# Patient Record
Sex: Female | Born: 1950 | Race: Black or African American | Hispanic: No | Marital: Married | Smoking: Never smoker
Health system: Southern US, Community
[De-identification: ages and names within clinical notes are randomized; demographics above are authoritative.]

## PROBLEM LIST (undated history)

## (undated) DIAGNOSIS — E785 Hyperlipidemia, unspecified: Secondary | ICD-10-CM

## (undated) DIAGNOSIS — E039 Hypothyroidism, unspecified: Secondary | ICD-10-CM

## (undated) HISTORY — DX: Hyperlipidemia, unspecified: E78.5

## (undated) HISTORY — PX: ABDOMINAL HYSTERECTOMY: SHX81

## (undated) HISTORY — PX: KNEE SURGERY: SHX244

## (undated) HISTORY — PX: TUBAL LIGATION: SHX77

---

## 2000-10-24 ENCOUNTER — Other Ambulatory Visit: Admission: RE | Admit: 2000-10-24 | Discharge: 2000-10-24 | Payer: Self-pay | Admitting: Family Medicine

## 2003-07-24 ENCOUNTER — Other Ambulatory Visit: Payer: Self-pay

## 2004-03-30 ENCOUNTER — Ambulatory Visit: Payer: Self-pay | Admitting: Family Medicine

## 2005-05-19 ENCOUNTER — Ambulatory Visit: Payer: Self-pay

## 2006-05-25 ENCOUNTER — Ambulatory Visit: Payer: Self-pay

## 2007-09-13 ENCOUNTER — Ambulatory Visit: Payer: Self-pay

## 2008-02-20 ENCOUNTER — Inpatient Hospital Stay: Payer: Self-pay | Admitting: General Practice

## 2008-09-17 ENCOUNTER — Ambulatory Visit: Payer: Self-pay

## 2009-09-17 ENCOUNTER — Ambulatory Visit: Payer: Self-pay

## 2010-11-11 ENCOUNTER — Ambulatory Visit: Payer: Self-pay

## 2011-11-12 ENCOUNTER — Ambulatory Visit: Payer: Self-pay | Admitting: Adult Health

## 2012-11-15 ENCOUNTER — Ambulatory Visit: Payer: Self-pay | Admitting: Family Medicine

## 2012-12-01 ENCOUNTER — Ambulatory Visit: Payer: Self-pay | Admitting: Surgery

## 2013-11-06 ENCOUNTER — Ambulatory Visit: Payer: Self-pay | Admitting: Family Medicine

## 2014-03-13 ENCOUNTER — Ambulatory Visit: Payer: Self-pay | Admitting: Family Medicine

## 2014-05-20 DIAGNOSIS — H35341 Macular cyst, hole, or pseudohole, right eye: Secondary | ICD-10-CM | POA: Diagnosis not present

## 2014-05-20 DIAGNOSIS — I10 Essential (primary) hypertension: Secondary | ICD-10-CM | POA: Diagnosis not present

## 2014-05-20 DIAGNOSIS — E785 Hyperlipidemia, unspecified: Secondary | ICD-10-CM | POA: Diagnosis not present

## 2014-05-20 DIAGNOSIS — E049 Nontoxic goiter, unspecified: Secondary | ICD-10-CM | POA: Diagnosis not present

## 2014-05-20 DIAGNOSIS — L729 Follicular cyst of the skin and subcutaneous tissue, unspecified: Secondary | ICD-10-CM | POA: Diagnosis not present

## 2014-05-21 DIAGNOSIS — E785 Hyperlipidemia, unspecified: Secondary | ICD-10-CM | POA: Diagnosis not present

## 2014-05-21 DIAGNOSIS — E049 Nontoxic goiter, unspecified: Secondary | ICD-10-CM | POA: Diagnosis not present

## 2014-08-07 DIAGNOSIS — Z23 Encounter for immunization: Secondary | ICD-10-CM | POA: Diagnosis not present

## 2014-08-07 DIAGNOSIS — Z008 Encounter for other general examination: Secondary | ICD-10-CM | POA: Diagnosis not present

## 2014-08-29 DIAGNOSIS — M1990 Unspecified osteoarthritis, unspecified site: Secondary | ICD-10-CM | POA: Diagnosis not present

## 2014-08-29 DIAGNOSIS — H35341 Macular cyst, hole, or pseudohole, right eye: Secondary | ICD-10-CM | POA: Diagnosis not present

## 2014-08-29 DIAGNOSIS — E049 Nontoxic goiter, unspecified: Secondary | ICD-10-CM | POA: Diagnosis not present

## 2014-08-29 DIAGNOSIS — I10 Essential (primary) hypertension: Secondary | ICD-10-CM | POA: Diagnosis not present

## 2014-08-29 DIAGNOSIS — E785 Hyperlipidemia, unspecified: Secondary | ICD-10-CM | POA: Diagnosis not present

## 2014-09-06 ENCOUNTER — Other Ambulatory Visit: Payer: Self-pay | Admitting: Family Medicine

## 2014-09-06 DIAGNOSIS — E042 Nontoxic multinodular goiter: Secondary | ICD-10-CM

## 2014-09-28 ENCOUNTER — Other Ambulatory Visit: Payer: Self-pay | Admitting: Family Medicine

## 2014-11-11 ENCOUNTER — Ambulatory Visit: Admission: RE | Admit: 2014-11-11 | Payer: Medicare Other | Source: Ambulatory Visit

## 2014-12-04 ENCOUNTER — Encounter: Payer: Self-pay | Admitting: *Deleted

## 2014-12-05 ENCOUNTER — Ambulatory Visit: Payer: Medicare Other | Admitting: Anesthesiology

## 2014-12-05 ENCOUNTER — Ambulatory Visit
Admission: RE | Admit: 2014-12-05 | Discharge: 2014-12-05 | Disposition: A | Discharge status: Home / Self Care | Payer: Medicare Other | Source: Ambulatory Visit | Attending: Gastroenterology | Admitting: Gastroenterology

## 2014-12-05 ENCOUNTER — Encounter: Payer: Self-pay | Admitting: *Deleted

## 2014-12-05 ENCOUNTER — Encounter
Admission: RE | Disposition: A | Discharge status: Home / Self Care | Payer: Self-pay | Source: Ambulatory Visit | Attending: Gastroenterology

## 2014-12-05 DIAGNOSIS — Z1211 Encounter for screening for malignant neoplasm of colon: Secondary | ICD-10-CM | POA: Diagnosis not present

## 2014-12-05 DIAGNOSIS — Z79899 Other long term (current) drug therapy: Secondary | ICD-10-CM | POA: Diagnosis not present

## 2014-12-05 DIAGNOSIS — E039 Hypothyroidism, unspecified: Secondary | ICD-10-CM | POA: Insufficient documentation

## 2014-12-05 DIAGNOSIS — I10 Essential (primary) hypertension: Secondary | ICD-10-CM | POA: Insufficient documentation

## 2014-12-05 DIAGNOSIS — K5790 Diverticulosis of intestine, part unspecified, without perforation or abscess without bleeding: Secondary | ICD-10-CM | POA: Diagnosis not present

## 2014-12-05 DIAGNOSIS — K5730 Diverticulosis of large intestine without perforation or abscess without bleeding: Secondary | ICD-10-CM | POA: Diagnosis not present

## 2014-12-05 HISTORY — DX: Hypothyroidism, unspecified: E03.9

## 2014-12-05 HISTORY — PX: COLONOSCOPY WITH PROPOFOL: SHX5780

## 2014-12-05 SURGERY — COLONOSCOPY WITH PROPOFOL
Anesthesia: General

## 2014-12-05 MED ORDER — FENTANYL CITRATE (PF) 100 MCG/2ML IJ SOLN
INTRAMUSCULAR | Status: DC | PRN
  Administered 2014-12-05: 50 ug via INTRAVENOUS

## 2014-12-05 MED ORDER — SODIUM CHLORIDE 0.9 % IV SOLN
INTRAVENOUS | Status: DC
  Administered 2014-12-05: 10:00:00 via INTRAVENOUS

## 2014-12-05 MED ORDER — PROPOFOL INFUSION 10 MG/ML OPTIME
INTRAVENOUS | Status: DC | PRN
  Administered 2014-12-05: 120 ug/kg/min via INTRAVENOUS

## 2014-12-05 MED ORDER — MIDAZOLAM HCL 2 MG/2ML IJ SOLN
INTRAMUSCULAR | Status: DC | PRN
  Administered 2014-12-05: 1 mg via INTRAVENOUS

## 2014-12-05 NOTE — Anesthesia Preprocedure Evaluation (Signed)
Anesthesia Evaluation  Patient identified by MRN, date of birth, ID band Patient awake    Reviewed: Allergy & Precautions, H&P , NPO status , Patient's Chart, lab work & pertinent test results, reviewed documented beta blocker date and time   History of Anesthesia Complications Negative for: history of anesthetic complications  Airway Mallampati: I  TM Distance: >3 FB Neck ROM: full    Dental no notable dental hx.    Pulmonary neg pulmonary ROS,  breath sounds clear to auscultation  Pulmonary exam normal       Cardiovascular Exercise Tolerance: Good hypertension, - angina- CAD, - Past MI and - CABG Normal cardiovascular examRhythm:regular Rate:Normal     Neuro/Psych negative neurological ROS  negative psych ROS   GI/Hepatic negative GI ROS, Neg liver ROS,   Endo/Other  neg diabetesHypothyroidism   Renal/GU negative Renal ROS  negative genitourinary   Musculoskeletal   Abdominal   Peds  Hematology negative hematology ROS (+)   Anesthesia Other Findings Past Medical History:   Hypertension                                                 Hypothyroidism                                               Reproductive/Obstetrics negative OB ROS                             Anesthesia Physical Anesthesia Plan  ASA: II  Anesthesia Plan: General   Post-op Pain Management:    Induction:   Airway Management Planned:   Additional Equipment:   Intra-op Plan:   Post-operative Plan:   Informed Consent: I have reviewed the patients History and Physical, chart, labs and discussed the procedure including the risks, benefits and alternatives for the proposed anesthesia with the patient or authorized representative who has indicated his/her understanding and acceptance.   Dental Advisory Given  Plan Discussed with: Anesthesiologist, CRNA and Surgeon  Anesthesia Plan Comments:          Anesthesia Quick Evaluation

## 2014-12-05 NOTE — Op Note (Signed)
[facility] Patient Name: Sharon Craig Procedure Date: 12/05/2014 10:44 AM MRN: [id number] Account #: [id number] Date of Birth: [birth year] Admit Type: Outpatient Age: 64 Room: [facility] ENDO ROOM 4 Gender: Female Note Status: Finalized Procedure:         Colonoscopy Indications:       Screening for colorectal malignant neoplasm Providers:         Ezzard Standing. Bluford Kaufmann, MD Referring MD:      Janeann Forehand, MD (Referring MD) Medicines:         Monitored Anesthesia Care Complications:     No immediate complications. Procedure:         Pre-Anesthesia Assessment:                    - Prior to the procedure, a History and Physical was                     performed, and patient medications, allergies and                     sensitivities were reviewed. The patient's tolerance of                     previous anesthesia was reviewed.                    - The risks and benefits of the procedure and the sedation                     options and risks were discussed with the patient. All                     questions were answered and informed consent was obtained.                    - After reviewing the risks and benefits, the patient was                     deemed in satisfactory condition to undergo the procedure.                    After obtaining informed consent, the colonoscope was                     passed under direct vision. Throughout the procedure, the                     patient's blood pressure, pulse, and oxygen saturations                     were monitored continuously. The Colonoscope was                     introduced through the anus and advanced to the the cecum,                     identified by appendiceal orifice and ileocecal valve. The                     colonoscopy was performed without difficulty. The patient                     tolerated the procedure well. The quality of the bowel  preparation was good. Findings:      A few small-mouthed diverticula were found in the sigmoid colon.      The exam was otherwise without abnormality. Impression:        - Diverticulosis in the sigmoid colon.                    - The examination was otherwise normal.                    - No specimens collected. Recommendation:    - Discharge patient to home.                    - Repeat colonoscopy in 10 years for surveillance.                    - The findings and recommendations were discussed with the                     patient. Procedure Code(s): --- Professional ---                    [phone], Colonoscopy, flexible; diagnostic, including                     collection of specimen(s) by brushing or washing, when                     performed (separate procedure) Diagnosis Code(s): --- Professional ---                    Z12.11, Encounter for screening for malignant neoplasm of                     colon                    K57.30, Diverticulosis of large intestine without                     perforation or abscess without bleeding CPT copyright 2014 American Medical Association. All rights reserved. The codes documented in this report are preliminary and upon coder review may  be revised to meet current compliance requirements. Wallace Cullens, MD 12/05/2014 10:56:38 AM This report has been signed electronically. Number of Addenda: 0 Note Initiated On: 12/05/2014 10:44 AM Scope Withdrawal Time: 0 hours 3 minutes 49 seconds  Total Procedure Duration: 0 hours 6 minutes 22 seconds       [facility]

## 2014-12-05 NOTE — Transfer of Care (Signed)
Immediate Anesthesia Transfer of Care Note  Patient: Sharon Craig  Procedure(s) Performed: Procedure(s): COLONOSCOPY WITH PROPOFOL (N/A)  Patient Location: PACU  Anesthesia Type:General  Level of Consciousness: awake and sedated  Airway & Oxygen Therapy: Patient Spontanous Breathing and Patient connected to nasal cannula oxygen  Post-op Assessment: Report given to RN and Post -op Vital signs reviewed and stable  Post vital signs: Reviewed and stable  Last Vitals:  Filed Vitals:   12/05/14 1013  BP: 118/97  Pulse: 84  Temp: 36.2 C  Resp: 20    Complications: No apparent anesthesia complications

## 2014-12-05 NOTE — Anesthesia Procedure Notes (Signed)
Performed by: COOK-MARTIN, Musette Kisamore Pre-anesthesia Checklist: Patient identified, Emergency Drugs available, Suction available, Patient being monitored and Timeout performed Patient Re-evaluated:Patient Re-evaluated prior to inductionOxygen Delivery Method: Nasal cannula Preoxygenation: Pre-oxygenation with 100% oxygen Intubation Type: IV induction Airway Equipment and Method: Oral airway Placement Confirmation: positive ETCO2 and CO2 detector       

## 2014-12-05 NOTE — H&P (Signed)
    Primary Care Physician:  Fidel Levy, MD Primary Gastroenterologist:  Dr. Bluford Kaufmann  Pre-Procedure History & Physical: HPI:  Sharon Craig is a 64 y.o. female is here for an colonoscopy.   Past Medical History  Diagnosis Date  . Hypertension   . Hypothyroidism     No past surgical history on file.  Prior to Admission medications   Medication Sig Start Date End Date Taking? Authorizing Provider  amLODipine (NORVASC) 5 MG tablet Take 5 mg by mouth daily.   Yes Historical Provider, MD  atenolol (TENORMIN) 50 MG tablet Take 50 mg by mouth daily.   Yes Historical Provider, MD  atorvastatin (LIPITOR) 20 MG tablet Take 20 mg by mouth daily.   Yes Historical Provider, MD  cetirizine (ZYRTEC) 10 MG tablet Take 10 mg by mouth daily.   Yes Historical Provider, MD  hydrochlorothiazide (HYDRODIURIL) 25 MG tablet Take 25 mg by mouth daily.   Yes Historical Provider, MD  omega-3 acid ethyl esters (LOVAZA) 1 G capsule Take by mouth 2 (two) times daily.   Yes Historical Provider, MD  levothyroxine (SYNTHROID, LEVOTHROID) 25 MCG tablet TAKE 1 TABLET BY MOUTH DAILY. 09/30/14   Amy Rusty Aus, NP    Allergies as of 11/19/2014  . (Not on File)    No family history on file.  Social History   Social History  . Marital Status: Married    Spouse Name: N/A  . Number of Children: N/A  . Years of Education: N/A   Occupational History  . Not on file.   Social History Main Topics  . Smoking status: Not on file  . Smokeless tobacco: Not on file  . Alcohol Use: Not on file  . Drug Use: Not on file  . Sexual Activity: Not on file   Other Topics Concern  . Not on file   Social History Narrative  . No narrative on file    Review of Systems: See HPI, otherwise negative ROS  Physical Exam: There were no vitals taken for this visit. General:   Alert,  pleasant and cooperative in NAD Head:  Normocephalic and atraumatic. Neck:  Supple; no masses or thyromegaly. Lungs:  Clear  throughout to auscultation.    Heart:  Regular rate and rhythm. Abdomen:  Soft, nontender and nondistended. Normal bowel sounds, without guarding, and without rebound.   Neurologic:  Alert and  oriented x4;  grossly normal neurologically.  Impression/Plan: Sharon Craig is here for an colonoscopy to be performed for screening.  Risks, benefits, limitations, and alternatives regarding  colonoscopy have been reviewed with the patient.  Questions have been answered.  All parties agreeable.   Yoshio Seliga, Ezzard Standing, MD  12/05/2014, 10:02 AM

## 2014-12-06 ENCOUNTER — Encounter: Payer: Self-pay | Admitting: Gastroenterology

## 2014-12-06 NOTE — Anesthesia Postprocedure Evaluation (Signed)
  Anesthesia Post-op Note  Patient: Sharon Craig  Procedure(s) Performed: Procedure(s): COLONOSCOPY WITH PROPOFOL (N/A)  Anesthesia type:General  Patient location: PACU  Post pain: Pain level controlled  Post assessment: Post-op Vital signs reviewed, Patient's Cardiovascular Status Stable, Respiratory Function Stable, Patent Airway and No signs of Nausea or vomiting  Post vital signs: Reviewed and stable  Last Vitals:  Filed Vitals:   12/05/14 1145  BP:   Pulse: 66  Temp:   Resp: 14    Level of consciousness: awake, alert  and patient cooperative  Complications: No apparent anesthesia complications

## 2014-12-06 NOTE — Progress Notes (Signed)
Patient "doing fine".  Reports no issues or questions.

## 2014-12-12 ENCOUNTER — Encounter: Payer: Self-pay | Admitting: Family Medicine

## 2014-12-12 ENCOUNTER — Ambulatory Visit (INDEPENDENT_AMBULATORY_CARE_PROVIDER_SITE_OTHER): Payer: Medicare Other | Admitting: Family Medicine

## 2014-12-12 VITALS — BP 116/77 | HR 67 | Temp 98.0°F | Resp 16 | Ht 68.0 in | Wt 218.0 lb

## 2014-12-12 DIAGNOSIS — E663 Overweight: Secondary | ICD-10-CM | POA: Insufficient documentation

## 2014-12-12 DIAGNOSIS — J302 Other seasonal allergic rhinitis: Secondary | ICD-10-CM | POA: Insufficient documentation

## 2014-12-12 DIAGNOSIS — E785 Hyperlipidemia, unspecified: Secondary | ICD-10-CM | POA: Insufficient documentation

## 2014-12-12 DIAGNOSIS — E669 Obesity, unspecified: Secondary | ICD-10-CM

## 2014-12-12 DIAGNOSIS — E039 Hypothyroidism, unspecified: Secondary | ICD-10-CM | POA: Insufficient documentation

## 2014-12-12 DIAGNOSIS — Z1331 Encounter for screening for depression: Secondary | ICD-10-CM

## 2014-12-12 DIAGNOSIS — E049 Nontoxic goiter, unspecified: Secondary | ICD-10-CM

## 2014-12-12 DIAGNOSIS — M1990 Unspecified osteoarthritis, unspecified site: Secondary | ICD-10-CM

## 2014-12-12 DIAGNOSIS — I10 Essential (primary) hypertension: Secondary | ICD-10-CM

## 2014-12-12 DIAGNOSIS — H35341 Macular cyst, hole, or pseudohole, right eye: Secondary | ICD-10-CM | POA: Insufficient documentation

## 2014-12-12 NOTE — Progress Notes (Signed)
Name: Sharon Craig   MRN: [id number]    DOB: Aug 02, 1950   Date:12/12/2014       Progress Note  Subjective  Chief Complaint  Chief Complaint  Patient presents with  . Hypertension  . Hyperlipidemia  . Hypothyroidism    thyroid nodule    HPI  Her for f/u of HBP, Hyperlipidemia, and thyroid nodule.  Needs thyroid US.  And needs TSH since upping Levothyroxine dose.  She is taking meds and feels well./  No problem-specific assessment & plan notes found for this encounter.   Past Medical History  Diagnosis Date  . Hypertension   . Hypothyroidism     Social History  Substance Use Topics  . Smoking status: Never Smoker   . Smokeless tobacco: Never Used  . Alcohol Use: No     Current outpatient prescriptions:  .  amLODipine (NORVASC) 10 MG tablet, Take 10 mg by mouth daily., Disp: , Rfl:  .  atenolol (TENORMIN) 50 MG tablet, Take 25 mg by mouth daily. , Disp: , Rfl:  .  atorvastatin (LIPITOR) 20 MG tablet, Take 20 mg by mouth daily., Disp: , Rfl:  .  cetirizine (ZYRTEC) 10 MG tablet, Take 10 mg by mouth daily., Disp: , Rfl:  .  DHA-EPA-VITAMIN E PO, Take 1 capsule by mouth daily., Disp: , Rfl:  .  hydrochlorothiazide (HYDRODIURIL) 25 MG tablet, Take 25 mg by mouth daily., Disp: , Rfl:  .  levothyroxine (SYNTHROID, LEVOTHROID) 25 MCG tablet, TAKE 1 TABLET BY MOUTH DAILY., Disp: 30 tablet, Rfl: 6 .  omega-3 acid ethyl esters (LOVAZA) 1 G capsule, Take by mouth 2 (two) times daily., Disp: , Rfl:   Allergies  Allergen Reactions  . Hydrocodone Nausea Only    Review of Systems  Constitutional: Negative for fever, chills, weight loss and malaise/fatigue.  HENT: Negative for hearing loss.   Eyes: Negative for blurred vision and double vision.  Respiratory: Negative for cough, sputum production, shortness of breath and wheezing.   Cardiovascular: Negative for chest pain, palpitations, orthopnea and leg swelling.  Gastrointestinal: Negative for heartburn, nausea,  vomiting, abdominal pain, diarrhea and blood in stool.  Genitourinary: Negative for dysuria, urgency and frequency.  Musculoskeletal: Negative for myalgias and joint pain.  Skin: Negative for rash.  Neurological: Negative for dizziness, sensory change, focal weakness, weakness and headaches.       Objective  Filed Vitals:   12/12/14 0908  BP: 116/77  Pulse: 67  Temp: 98 F (36.7 C)  TempSrc: Oral  Resp: 16  Height:  (1.727 m)  Weight: 218 lb (98.884 kg)     Physical Exam  Constitutional: She is well-developed, well-nourished, and in no distress.  HENT:  Head: Normocephalic and atraumatic.  Eyes: Conjunctivae and EOM are normal. Pupils are equal, round, and reactive to light.  Neck: Normal range of motion. Neck supple. Carotid bruit is not present. Thyroid mass (bilateral thyroid nodules) and thyromegaly present.  Cardiovascular: Normal rate, regular rhythm, normal heart sounds and intact distal pulses.  Exam reveals no gallop and no friction rub.   No murmur heard. Pulmonary/Chest: Effort normal and breath sounds normal. No respiratory distress. She has no wheezes. She has no rales.  Abdominal: Soft. Bowel sounds are normal. She exhibits no distension and no mass. There is no tenderness.  Musculoskeletal: She exhibits no edema.  Lymphadenopathy:    She has no cervical adenopathy.  Vitals reviewed.      No results found for this or any previous visit (  from the past 2160 hour(s)).   Assessment & Plan  1. Essential hypertension, malignant  - Comprehensive Metabolic Panel (CMET)  2. Enlargement of thyroid  - US Soft Tissue Head/Neck; Future-thyroid - CBC with Differential - TSH  3. Dyslipidemia  - Lipid Profile

## 2014-12-12 NOTE — Patient Instructions (Signed)
Continue current meds.    Discussed diet and weight loss.

## 2014-12-13 LAB — COMPREHENSIVE METABOLIC PANEL
A/G RATIO: 1.7 (ref 1.1–2.5)
ALBUMIN: 4.3 g/dL (ref 3.6–4.8)
ALK PHOS: 102 IU/L (ref 39–117)
ALT: 17 IU/L (ref 0–32)
AST: 17 IU/L (ref 0–40)
BILIRUBIN TOTAL: 0.5 mg/dL (ref 0.0–1.2)
BUN / CREAT RATIO: 22 (ref 11–26)
BUN: 15 mg/dL (ref 8–27)
CHLORIDE: 99 mmol/L (ref 97–108)
CO2: 27 mmol/L (ref 18–29)
Calcium: 9.6 mg/dL (ref 8.7–10.3)
Creatinine, Ser: 0.68 mg/dL (ref 0.57–1.00)
GFR calc non Af Amer: 91 mL/min/{1.73_m2} (ref >59)
GFR, EST AFRICAN AMERICAN: 105 mL/min/{1.73_m2} (ref >59)
GLUCOSE: 93 mg/dL (ref 65–99)
Globulin, Total: 2.5 g/dL (ref 1.5–4.5)
POTASSIUM: 3.7 mmol/L (ref 3.5–5.2)
Sodium: 141 mmol/L (ref 134–144)
TOTAL PROTEIN: 6.8 g/dL (ref 6.0–8.5)

## 2014-12-13 LAB — CBC WITH DIFFERENTIAL/PLATELET
Basophils Absolute: 0.1 10*3/uL (ref 0.0–0.2)
Basos: 1 %
EOS (ABSOLUTE): 0.1 10*3/uL (ref 0.0–0.4)
Eos: 2 %
Hematocrit: 37.5 % (ref 34.0–46.6)
Hemoglobin: 12 g/dL (ref 11.1–15.9)
Immature Grans (Abs): 0 10*3/uL (ref 0.0–0.1)
Immature Granulocytes: 0 %
Lymphocytes Absolute: 2.1 10*3/uL (ref 0.7–3.1)
Lymphs: 38 %
MCH: 21.5 pg — ABNORMAL LOW (ref 26.6–33.0)
MCHC: 32 g/dL (ref 31.5–35.7)
MCV: 67 fL — ABNORMAL LOW (ref 79–97)
Monocytes Absolute: 0.5 10*3/uL (ref 0.1–0.9)
Monocytes: 10 %
Neutrophils Absolute: 2.8 10*3/uL (ref 1.4–7.0)
Neutrophils: 49 %
Platelets: 181 10*3/uL (ref 150–379)
RBC: 5.57 x10E6/uL — ABNORMAL HIGH (ref 3.77–5.28)
RDW: 16.6 % — ABNORMAL HIGH (ref 12.3–15.4)
WBC: 5.6 10*3/uL (ref 3.4–10.8)

## 2014-12-13 LAB — LIPID PANEL
Chol/HDL Ratio: 3.4 ratio units (ref 0.0–4.4)
Cholesterol, Total: 162 mg/dL (ref 100–199)
HDL: 47 mg/dL (ref >39)
LDL Calculated: 97 mg/dL (ref 0–99)
Triglycerides: 90 mg/dL (ref 0–149)
VLDL Cholesterol Cal: 18 mg/dL (ref 5–40)

## 2014-12-13 LAB — TSH: TSH: 2.02 u[IU]/mL (ref 0.450–4.500)

## 2014-12-17 ENCOUNTER — Ambulatory Visit
Admission: RE | Admit: 2014-12-17 | Discharge: 2014-12-17 | Disposition: A | Discharge status: Home / Self Care | Payer: Medicare Other | Source: Ambulatory Visit | Attending: Family Medicine | Admitting: Family Medicine

## 2014-12-17 DIAGNOSIS — E042 Nontoxic multinodular goiter: Secondary | ICD-10-CM | POA: Diagnosis not present

## 2014-12-17 DIAGNOSIS — E049 Nontoxic goiter, unspecified: Secondary | ICD-10-CM

## 2014-12-27 DIAGNOSIS — H2513 Age-related nuclear cataract, bilateral: Secondary | ICD-10-CM | POA: Diagnosis not present

## 2015-02-12 ENCOUNTER — Other Ambulatory Visit: Payer: Self-pay | Admitting: *Deleted

## 2015-02-12 ENCOUNTER — Telehealth: Payer: Self-pay | Admitting: Family Medicine

## 2015-02-12 DIAGNOSIS — Z87898 Personal history of other specified conditions: Secondary | ICD-10-CM

## 2015-02-12 DIAGNOSIS — Z1239 Encounter for other screening for malignant neoplasm of breast: Secondary | ICD-10-CM

## 2015-02-12 NOTE — Telephone Encounter (Signed)
Done and patient given # to call and schedule.

## 2015-02-12 NOTE — Telephone Encounter (Signed)
Is this just a reg. Mammogram?

## 2015-02-12 NOTE — Telephone Encounter (Signed)
Pt needs a mammo scheduled for the end of Nov or first of Dec.  She discussed a knot under her arm at last visit.  Her call back number is [phone]

## 2015-02-12 NOTE — Telephone Encounter (Signed)
Yes, just order a regular screening mammogram.-jh

## 2015-03-17 ENCOUNTER — Encounter: Payer: Self-pay | Admitting: Family Medicine

## 2015-03-17 ENCOUNTER — Ambulatory Visit (INDEPENDENT_AMBULATORY_CARE_PROVIDER_SITE_OTHER): Payer: Medicare Other | Admitting: Family Medicine

## 2015-03-17 VITALS — BP 130/65 | HR 60 | Resp 16 | Ht 68.0 in | Wt 221.8 lb

## 2015-03-17 DIAGNOSIS — E049 Nontoxic goiter, unspecified: Secondary | ICD-10-CM

## 2015-03-17 DIAGNOSIS — E785 Hyperlipidemia, unspecified: Secondary | ICD-10-CM | POA: Diagnosis not present

## 2015-03-17 DIAGNOSIS — I10 Essential (primary) hypertension: Secondary | ICD-10-CM

## 2015-03-17 DIAGNOSIS — Z23 Encounter for immunization: Secondary | ICD-10-CM

## 2015-03-17 MED ORDER — ATENOLOL 25 MG PO TABS: 25.0000 mg | ORAL_TABLET | Freq: Every day | ORAL | Status: DC

## 2015-03-17 NOTE — Addendum Note (Signed)
Addended by: Clayborne ArtistHOLDEN, Zamiya Dillard A on: 03/17/2015 12:02 PM   Modules accepted: Kipp BroodSmartSet

## 2015-03-17 NOTE — Patient Instructions (Signed)
Continue current meds at same dose.

## 2015-03-17 NOTE — Progress Notes (Signed)
Name: Sharon Craig   MRN: [id number]    DOB: May 20, 1950   Date:03/17/2015       Progress Note  Subjective  Chief Complaint  Chief Complaint  Patient presents with  . Hypertension    trouble splitting Atenolol.     HPI Here for f/u of HBP.  Also hypothyroid.  Taking all meds and feeling well  No problem-specific assessment & plan notes found for this encounter.   Past Medical History  Diagnosis Date  . Hypertension   . Hypothyroidism     Past Surgical History  Procedure Laterality Date  . Abdominal hysterectomy    . Knee surgery Left   . Colonoscopy with propofol N/A 12/05/2014    Procedure: COLONOSCOPY WITH PROPOFOL;  Surgeon: Wallace Cullens, MD;  Location: [facility] ENDOSCOPY;  Service: Gastroenterology;  Laterality: N/A;  . Tubal ligation      Family History  Problem Relation Age of Onset  . Heart failure Mother   . Hypertension Mother   . Gout Mother   . Heart Problems Sister   . Heart Problems Brother     Social History   Social History  . Marital Status: Married    Spouse Name: N/A  . Number of Children: N/A  . Years of Education: N/A   Occupational History  . Not on file.   Social History Main Topics  . Smoking status: Never Smoker   . Smokeless tobacco: Never Used  . Alcohol Use: No  . Drug Use: No  . Sexual Activity: Not on file   Other Topics Concern  . Not on file   Social History Narrative     Current outpatient prescriptions:  .  amLODipine (NORVASC) 10 MG tablet, Take 10 mg by mouth daily., Disp: , Rfl:  .  atenolol (TENORMIN) 25 MG tablet, Take 1 tablet (25 mg total) by mouth daily., Disp: 30 tablet, Rfl: 12 .  atorvastatin (LIPITOR) 20 MG tablet, Take 20 mg by mouth daily., Disp: , Rfl:  .  cetirizine (ZYRTEC) 10 MG tablet, Take 10 mg by mouth daily., Disp: , Rfl:  .  ferrous sulfate 325 (65 FE) MG tablet, Take 325 mg by mouth daily with breakfast., Disp: , Rfl:  .  hydrochlorothiazide (HYDRODIURIL) 25 MG tablet, Take 25 mg by  mouth daily., Disp: , Rfl:  .  levothyroxine (SYNTHROID, LEVOTHROID) 25 MCG tablet, TAKE 1 TABLET BY MOUTH DAILY., Disp: 30 tablet, Rfl: 6  Allergies  Allergen Reactions  . Hydrocodone Nausea Only     Review of Systems  Constitutional: Negative for fever, chills, weight loss and malaise/fatigue.  HENT: Negative for hearing loss.   Eyes: Negative for blurred vision and double vision.  Respiratory: Negative for cough, shortness of breath and wheezing.   Cardiovascular: Negative for chest pain, palpitations and leg swelling.  Gastrointestinal: Negative for heartburn, abdominal pain and blood in stool.  Genitourinary: Negative for dysuria, urgency and frequency.  Musculoskeletal: Negative for myalgias and joint pain.  Skin: Negative for rash.  Neurological: Positive for headaches (occasional). Negative for dizziness, tremors and weakness.      Objective  Filed Vitals:   03/17/15 0946 03/17/15 1041  BP: 120/73 130/65  Pulse: 60   Resp: 16   Height:  (1.727 m)   Weight: 221 lb 12.8 oz (100.608 kg)     Physical Exam  Constitutional: She is oriented to person, place, and time and well-developed, well-nourished, and in no distress. No distress.  HENT:  Head: Normocephalic and  atraumatic.  Eyes: Conjunctivae and EOM are normal. Pupils are equal, round, and reactive to light. No scleral icterus.  Neck: Normal range of motion. Neck supple. Carotid bruit is not present. Thyromegaly (bilateral thyroid goiter.) present.  Cardiovascular: Normal rate, regular rhythm, normal heart sounds and intact distal pulses.  Exam reveals no gallop and no friction rub.   No murmur heard. Pulmonary/Chest: Effort normal and breath sounds normal. No respiratory distress. She has no wheezes. She has no rales.  Abdominal: Soft. Bowel sounds are normal. She exhibits no distension, no abdominal bruit and no mass. There is no tenderness.  Musculoskeletal: Normal range of motion. She exhibits edema  (bilateral trace pedal edema.).  Lymphadenopathy:    She has no cervical adenopathy.  Neurological: She is alert and oriented to person, place, and time.  Vitals reviewed.      No results found for this or any previous visit (from the past 2160 hour(s)).   Assessment & Plan  Problem List Items Addressed This Visit      Cardiovascular and Mediastinum   Essential hypertension, malignant - Primary   Relevant Medications   atenolol (TENORMIN) 25 MG tablet     Endocrine   Enlargement of thyroid   Relevant Medications   atenolol (TENORMIN) 25 MG tablet     Other   Dyslipidemia   Need for influenza vaccination   Relevant Orders   Flu vaccine HIGH DOSE PF (Fluzone High dose)      Meds ordered this encounter  Medications  . ferrous sulfate 325 (65 FE) MG tablet    Sig: Take 325 mg by mouth daily with breakfast.  . atenolol (TENORMIN) 25 MG tablet    Sig: Take 1 tablet (25 mg total) by mouth daily.    Dispense:  30 tablet    Refill:  12   1. Essential hypertension, malignant  - atenolol (TENORMIN) 25 MG tablet; Take 1 tablet (25 mg total) by mouth daily.  Dispense: 30 tablet; Refill: 12  2. Need for influenza vaccination  - Flu vaccine HIGH DOSE PF (Fluzone High dose)  3. Enlargement of thyroid   4. Dyslipidemia

## 2015-03-18 ENCOUNTER — Other Ambulatory Visit: Payer: Self-pay | Admitting: Family Medicine

## 2015-03-18 ENCOUNTER — Ambulatory Visit
Admission: RE | Admit: 2015-03-18 | Discharge: 2015-03-18 | Disposition: A | Discharge status: Home / Self Care | Payer: Medicare Other | Source: Ambulatory Visit | Attending: Family Medicine | Admitting: Family Medicine

## 2015-03-18 DIAGNOSIS — R928 Other abnormal and inconclusive findings on diagnostic imaging of breast: Secondary | ICD-10-CM | POA: Diagnosis not present

## 2015-03-18 DIAGNOSIS — Z87898 Personal history of other specified conditions: Secondary | ICD-10-CM

## 2015-03-18 DIAGNOSIS — Z1239 Encounter for other screening for malignant neoplasm of breast: Secondary | ICD-10-CM

## 2015-04-03 ENCOUNTER — Other Ambulatory Visit: Payer: Self-pay | Admitting: Family Medicine

## 2015-04-16 ENCOUNTER — Other Ambulatory Visit: Payer: Self-pay | Admitting: Family Medicine

## 2015-05-04 ENCOUNTER — Other Ambulatory Visit: Payer: Self-pay | Admitting: Family Medicine

## 2015-06-06 DIAGNOSIS — H2513 Age-related nuclear cataract, bilateral: Secondary | ICD-10-CM | POA: Diagnosis not present

## 2015-06-06 DIAGNOSIS — H35341 Macular cyst, hole, or pseudohole, right eye: Secondary | ICD-10-CM | POA: Diagnosis not present

## 2015-07-07 ENCOUNTER — Other Ambulatory Visit: Payer: Self-pay | Admitting: Family Medicine

## 2015-07-29 ENCOUNTER — Other Ambulatory Visit: Payer: Self-pay | Admitting: Family Medicine

## 2015-08-21 ENCOUNTER — Encounter: Payer: Self-pay | Admitting: Family Medicine

## 2015-08-21 ENCOUNTER — Ambulatory Visit (INDEPENDENT_AMBULATORY_CARE_PROVIDER_SITE_OTHER): Payer: Medicare Other | Admitting: Family Medicine

## 2015-08-21 ENCOUNTER — Other Ambulatory Visit: Payer: Self-pay | Admitting: Family Medicine

## 2015-08-21 VITALS — BP 119/61 | HR 67 | Temp 98.2°F | Resp 16 | Ht 68.0 in | Wt 219.0 lb

## 2015-08-21 DIAGNOSIS — Z658 Other specified problems related to psychosocial circumstances: Secondary | ICD-10-CM

## 2015-08-21 DIAGNOSIS — E049 Nontoxic goiter, unspecified: Secondary | ICD-10-CM

## 2015-08-21 DIAGNOSIS — I10 Essential (primary) hypertension: Secondary | ICD-10-CM

## 2015-08-21 DIAGNOSIS — R51 Headache: Secondary | ICD-10-CM | POA: Diagnosis not present

## 2015-08-21 DIAGNOSIS — F439 Reaction to severe stress, unspecified: Secondary | ICD-10-CM

## 2015-08-21 DIAGNOSIS — J302 Other seasonal allergic rhinitis: Secondary | ICD-10-CM

## 2015-08-21 DIAGNOSIS — R519 Headache, unspecified: Secondary | ICD-10-CM

## 2015-08-21 MED ORDER — FLUTICASONE PROPIONATE 50 MCG/ACT NA SUSP: 2.0000 | Freq: Every day | NASAL | Status: DC

## 2015-08-21 NOTE — Patient Instructions (Addendum)
Allergies: Let's try a flonase or flonase sensimist OTC for allergies- it is 15.99 and there are coupons online.  Try allergy medication daily to help with HA. Tylenol and ibuprofen are fine to help with HA.   Stress- Try 30 minutes of exercise daily to help with stress. There are also great videos on Youtube for mindfulness/meditation for stress relief. If you want to try a medication for stress relief, please let us know.

## 2015-08-21 NOTE — Assessment & Plan Note (Signed)
Controlled. Continue current regimen. Check BMET. Encouraged dash diet. Recheck 3 mos.

## 2015-08-21 NOTE — Assessment & Plan Note (Signed)
Likely contributing to HA. Start Flonase daily to help with symptoms. Continue zyrtec.

## 2015-08-21 NOTE — Assessment & Plan Note (Signed)
Stable. Check TSH. US due on August 2017.

## 2015-08-21 NOTE — Progress Notes (Signed)
Subjective:    Patient ID: Sharon Craig, female    DOB: 28-Sep-1950, 65 y.o.   MRN: [id number]  HPI: Sharon Craig is a 65 y.o. female presenting on 08/21/2015 for Hypertension and Headache   HPI  Pt presents for blood pressure follow-up. Doing well at home. No CP or SOB. No syncopal episodes. No dizziness. No visual change- known macular issues. Taking medications as prescribed She is also having headaches. Did not check blood pressure.  Had previously HA. She thinks it is related to her allergies or previous head injury. HA is frontal and she puts vicks vapor rub on it to help with her symptoms. It does relief the HA.  She does have allergies in her eyes. HA started with tingling in head and have worsened with allergy season.  Thyroids: Taking 25mg  Thyroid goiter feels the same.  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 She feels her HA increased due to stress. No SI/HI.  Just feeling overwhelmed with current obligations.   Past Medical History  Diagnosis Date  . Hypertension   . Hypothyroidism     Current Outpatient Prescriptions on File Prior to Visit  Medication Sig  . amLODipine (NORVASC) 10 MG tablet TAKE 1 TABLET BY MOUTH DAILY  . atenolol (TENORMIN) 25 MG tablet Take 1 tablet (25 mg total) by mouth daily.  Marland Kitchen atorvastatin (LIPITOR) 20 MG tablet TAKE 1 TABLET BY MOUTH EVERY DAY  . cetirizine (ZYRTEC) 10 MG tablet Take 10 mg by mouth daily.  . ferrous sulfate 325 (65 FE) MG tablet Take 325 mg by mouth daily with breakfast.  . hydrochlorothiazide (HYDRODIURIL) 25 MG tablet TAKE 1 TABLET BY MOUTH EVERY DAY  . levothyroxine (SYNTHROID, LEVOTHROID) 25 MCG tablet TAKE 1 TABLET BY MOUTH DAILY   No current facility-administered medications on file prior to visit.    Review of Systems  Constitutional: Negative for fever and chills.  HENT: Negative for drooling and trouble swallowing.   Eyes: Negative for visual disturbance.    Respiratory: Negative for cough, chest tightness and wheezing.   Cardiovascular: Negative for chest pain and leg swelling.  Gastrointestinal: Negative for nausea, vomiting, abdominal pain, diarrhea and constipation.  Endocrine: Negative.  Negative for cold intolerance, heat intolerance, polydipsia, polyphagia and polyuria.  Genitourinary: Negative for dysuria and difficulty urinating.  Musculoskeletal: Negative.   Neurological: Positive for headaches. Negative for dizziness, syncope, facial asymmetry, weakness, light-headedness and numbness.  Psychiatric/Behavioral: Negative.    Per HPI unless specifically indicated above     Objective:    BP 119/61 mmHg  Pulse 67  Temp(Src) 98.2 F (36.8 C) (Oral)  Resp 16  Ht 5\' 8"  (1.727 m)  Wt 219 lb (99.338 kg)  BMI 33.31 kg/m2  Wt Readings from Last 3 Encounters:  08/21/15 219 lb (99.338 kg)  03/17/15 221 lb 12.8 oz (100.608 kg)  12/12/14 218 lb (98.884 kg)    Physical Exam  Constitutional: She is oriented to person, place, and time. She appears well-developed and well-nourished.  HENT:  Head: Normocephalic and atraumatic.  Neck: Neck supple. No muscular tenderness present. Carotid bruit is not present. Thyromegaly present. No thyroid mass present.  Cardiovascular: Normal rate, regular rhythm and normal heart sounds.  Exam reveals no gallop and no friction rub.   No murmur heard. Pulmonary/Chest: Effort normal and breath sounds normal. She has no wheezes. She exhibits no tenderness.  Abdominal: Soft. Normal appearance and bowel sounds are normal. She exhibits no distension  and no mass. There is no tenderness. There is no rebound and no guarding.  Musculoskeletal: Normal range of motion. She exhibits no edema or tenderness.  Lymphadenopathy:    She has no cervical adenopathy.  Neurological: She is alert and oriented to person, place, and time.  Skin: Skin is warm and dry.  Psychiatric: She has a normal mood and affect. Her speech is  normal and behavior is normal. Judgment and thought content normal. Cognition and memory are normal.   Results for orders placed or performed in visit on 12/12/14  Comprehensive Metabolic Panel (CMET)  Result Value Ref Range   Glucose 93 65 - 99 mg/dL   BUN 15 8 - 27 mg/dL   Creatinine, Ser 1.610.68 0.57 - 1.00 mg/dL   GFR calc non Af Amer 91 >59 mL/min/1.73   GFR calc Af Amer 105 >59 mL/min/1.73   BUN/Creatinine Ratio 22 11 - 26   Sodium 141 134 - 144 mmol/L   Potassium 3.7 3.5 - 5.2 mmol/L   Chloride 99 97 - 108 mmol/L   CO2 27 18 - 29 mmol/L   Calcium 9.6 8.7 - 10.3 mg/dL   Total Protein 6.8 6.0 - 8.5 g/dL   Albumin 4.3 3.6 - 4.8 g/dL   Globulin, Total 2.5 1.5 - 4.5 g/dL   Albumin/Globulin Ratio 1.7 1.1 - 2.5   Bilirubin Total 0.5 0.0 - 1.2 mg/dL   Alkaline Phosphatase 102 39 - 117 IU/L   AST 17 0 - 40 IU/L   ALT 17 0 - 32 IU/L  CBC with Differential  Result Value Ref Range   WBC 5.6 3.4 - 10.8 x10E3/uL   RBC 5.57 (H) 3.77 - 5.28 x10E6/uL   Hemoglobin 12.0 11.1 - 15.9 g/dL   Hematocrit 09.637.5 04.534.0 - 46.6 %   MCV 67 (L) 79 - 97 fL   MCH 21.5 (L) 26.6 - 33.0 pg   MCHC 32.0 31.5 - 35.7 g/dL   RDW 40.916.6 (H) 81.112.3 - 91.415.4 %   Platelets 181 150 - 379 x10E3/uL   Neutrophils 49 %   Lymphs 38 %   Monocytes 10 %   Eos 2 %   Basos 1 %   Neutrophils Absolute 2.8 1.4 - 7.0 x10E3/uL   Lymphocytes Absolute 2.1 0.7 - 3.1 x10E3/uL   Monocytes Absolute 0.5 0.1 - 0.9 x10E3/uL   EOS (ABSOLUTE) 0.1 0.0 - 0.4 x10E3/uL   Basophils Absolute 0.1 0.0 - 0.2 x10E3/uL   Immature Granulocytes 0 %   Immature Grans (Abs) 0.0 0.0 - 0.1 x10E3/uL  Lipid Profile  Result Value Ref Range   Cholesterol, Total 162 100 - 199 mg/dL   Triglycerides 90 0 - 149 mg/dL   HDL 47 >78>39 mg/dL   VLDL Cholesterol Cal 18 5 - 40 mg/dL   LDL Calculated 97 0 - 99 mg/dL   Chol/HDL Ratio 3.4 0.0 - 4.4 ratio units  TSH  Result Value Ref Range   TSH 2.020 0.450 - 4.500 uIU/mL      Assessment & Plan:   Problem List Items  Addressed This Visit      Cardiovascular and Mediastinum   Benign essential HTN - Primary    Controlled. Continue current regimen. Check BMET. Encouraged dash diet. Recheck 3 mos.         Respiratory   Seasonal allergic rhinitis    Likely contributing to HA. Start Flonase daily to help with symptoms. Continue zyrtec.       Relevant Medications   fluticasone (FLONASE)  50 MCG/ACT nasal spray     Endocrine   Enlargement of thyroid    Stable. Check TSH. [place] due on August 2017.       Relevant Orders   TSH    Other Visit Diagnoses    Headache around the eyes        Stress vs. sinus related. Encouraged stress relief and allergy treatment. PRN tylenol. Alarm symptoms reviewed. Return if not improving.     Stress        Increase due to recent family obligations. Recommend daily exercise of mental health, mindfulness, and meditation. Consider low dose SSRI if symptoms don't impr       Meds ordered this encounter  Medications  . fluticasone (FLONASE) 50 MCG/ACT nasal spray    Sig: Place 2 sprays into both nostrils daily.    Dispense:  16 g    Refill:  11    Order Specific Question:  Supervising Provider    Answer:  Janeann Forehand [phone]      Follow up plan: Return in about 3 months (around 11/21/2015) for HTN.

## 2015-09-02 DIAGNOSIS — E049 Nontoxic goiter, unspecified: Secondary | ICD-10-CM | POA: Diagnosis not present

## 2015-09-02 DIAGNOSIS — I10 Essential (primary) hypertension: Secondary | ICD-10-CM | POA: Diagnosis not present

## 2015-09-03 LAB — BASIC METABOLIC PANEL
BUN / CREAT RATIO: 17 (ref 12–28)
BUN: 13 mg/dL (ref 8–27)
CO2: 27 mmol/L (ref 18–29)
CREATININE: 0.75 mg/dL (ref 0.57–1.00)
Calcium: 9.5 mg/dL (ref 8.7–10.3)
Chloride: 96 mmol/L (ref 96–106)
GFR, EST AFRICAN AMERICAN: 96 mL/min/{1.73_m2} (ref >59)
GFR, EST NON AFRICAN AMERICAN: 83 mL/min/{1.73_m2} (ref >59)
GLUCOSE: 98 mg/dL (ref 65–99)
Potassium: 4 mmol/L (ref 3.5–5.2)
SODIUM: 139 mmol/L (ref 134–144)

## 2015-09-03 LAB — TSH: TSH: 3.42 u[IU]/mL (ref 0.450–4.500)

## 2015-09-09 ENCOUNTER — Telehealth: Payer: Self-pay | Admitting: Family Medicine

## 2015-09-09 NOTE — Telephone Encounter (Signed)
Pt. Called requesting someone to call her back about her  Labs (iron). Pt call back # is  [phone]

## 2015-09-09 NOTE — Telephone Encounter (Signed)
Returned patient call she wanted to know how iron was. Informed patient that iron was not checked just tsh and bmp.

## 2015-10-21 ENCOUNTER — Other Ambulatory Visit: Payer: Self-pay | Admitting: Family Medicine

## 2015-11-25 ENCOUNTER — Other Ambulatory Visit: Payer: Self-pay | Admitting: Family Medicine

## 2015-11-25 MED ORDER — METOPROLOL SUCCINATE ER 25 MG PO TB24: 25.0000 mg | ORAL_TABLET | Freq: Every day | ORAL | 3 refills | Status: DC

## 2015-11-26 ENCOUNTER — Telehealth: Payer: Self-pay | Admitting: *Deleted

## 2015-11-26 NOTE — Telephone Encounter (Signed)
Patient aware blood pressure medication has been changed to Metoprolol due to pharmacy back order.

## 2015-11-27 ENCOUNTER — Ambulatory Visit: Payer: Medicare Other | Admitting: Family Medicine

## 2015-12-11 LAB — MICROALBUMIN, URINE: Microalb, Ur: NORMAL

## 2015-12-11 LAB — HM DIABETES FOOT EXAM: HM Diabetic Foot Exam: NORMAL

## 2015-12-18 ENCOUNTER — Encounter: Payer: Self-pay | Admitting: Family Medicine

## 2015-12-18 ENCOUNTER — Ambulatory Visit (INDEPENDENT_AMBULATORY_CARE_PROVIDER_SITE_OTHER): Payer: Medicare Other | Admitting: Family Medicine

## 2015-12-18 VITALS — BP 126/77 | HR 73 | Temp 98.2°F | Resp 16 | Ht 68.0 in | Wt 214.0 lb

## 2015-12-18 DIAGNOSIS — E669 Obesity, unspecified: Secondary | ICD-10-CM | POA: Diagnosis not present

## 2015-12-18 DIAGNOSIS — E785 Hyperlipidemia, unspecified: Secondary | ICD-10-CM

## 2015-12-18 DIAGNOSIS — E049 Nontoxic goiter, unspecified: Secondary | ICD-10-CM | POA: Diagnosis not present

## 2015-12-18 DIAGNOSIS — M25512 Pain in left shoulder: Secondary | ICD-10-CM | POA: Diagnosis not present

## 2015-12-18 DIAGNOSIS — I10 Essential (primary) hypertension: Secondary | ICD-10-CM | POA: Diagnosis not present

## 2015-12-18 MED ORDER — MELOXICAM 15 MG PO TABS: 15.0000 mg | ORAL_TABLET | Freq: Every day | ORAL | 3 refills | Status: DC

## 2015-12-18 NOTE — Progress Notes (Signed)
Name: Sharon Craig   MRN: [id number]    DOB: 01/26/1951   Date:12/18/2015       Progress Note  Subjective  Chief Complaint  Chief Complaint  Patient presents with  . Hypertension    HPI Here for f/u of HBP. She has not started Metoprolol yet.  Using up her Atenolol first, but will change next week.  She is feeling well overall, except for pain in L post shoulder (shoulder trap).  She thinks it may be from L shoulder pain No problem-specific Assessment & Plan notes found for this encounter.   Past Medical History:  Diagnosis Date  . Hypertension   . Hypothyroidism     Past Surgical History:  Procedure Laterality Date  . ABDOMINAL HYSTERECTOMY    . COLONOSCOPY WITH PROPOFOL N/A 12/05/2014   Procedure: COLONOSCOPY WITH PROPOFOL;  Surgeon: Wallace CullensPaul Y Oh, MD;  [facility];  Service: Gastroenterology;  Laterality: N/A;  . KNEE SURGERY Left   . TUBAL LIGATION      Family History  Problem Relation Age of Onset  . Heart failure Mother   . Hypertension Mother   . Gout Mother   . Heart Problems Sister   . Heart Problems Brother   . Breast cancer Neg Hx     Social History   Social History  . Marital status: Married    Spouse name: N/A  . Number of children: N/A  . Years of education: N/A   Occupational History  . Not on file.   Social History Main Topics  . Smoking status: Never Smoker  . Smokeless tobacco: Never Used  . Alcohol use No  . Drug use: No  . Sexual activity: Not on file   Other Topics Concern  . Not on file   Social History Narrative  . No narrative on file     Current Outpatient Prescriptions:  .  amLODipine (NORVASC) 10 MG tablet, TAKE 1 TABLET BY MOUTH DAILY, Disp: 90 tablet, Rfl: 3 .  atorvastatin (LIPITOR) 20 MG tablet, TAKE 1 TABLET BY MOUTH EVERY DAY, Disp: 30 tablet, Rfl: 11 .  cetirizine (ZYRTEC) 10 MG tablet, Take 10 mg by mouth daily., Disp: , Rfl:  .  ferrous sulfate 325 (65 FE) MG tablet, Take 325 mg by mouth daily  with breakfast., Disp: , Rfl:  .  fluticasone (FLONASE) 50 MCG/ACT nasal spray, Place 2 sprays into both nostrils daily., Disp: 16 g, Rfl: 11 .  hydrochlorothiazide (HYDRODIURIL) 25 MG tablet, TAKE 1 TABLET BY MOUTH EVERY DAY, Disp: 90 tablet, Rfl: 3 .  levothyroxine (SYNTHROID, LEVOTHROID) 25 MCG tablet, TAKE 1 TABLET BY MOUTH DAILY, Disp: 30 tablet, Rfl: 11 .  meloxicam (MOBIC) 15 MG tablet, Take 1 tablet (15 mg total) by mouth daily., Disp: 30 tablet, Rfl: 3 .  metoprolol succinate (TOPROL-XL) 25 MG 24 hr tablet, Take 1 tablet (25 mg total) by mouth daily. (Patient not taking: Reported on 12/18/2015), Disp: 90 tablet, Rfl: 3  Allergies  Allergen Reactions  . Hydrocodone Nausea Only     Review of Systems  Constitutional: Negative for chills, fever, malaise/fatigue and weight loss.  HENT: Negative for hearing loss.   Eyes: Negative for blurred vision and double vision.  Respiratory: Negative for cough, shortness of breath and wheezing.   Cardiovascular: Negative for chest pain and leg swelling.  Gastrointestinal: Negative for abdominal pain, blood in stool and heartburn.  Genitourinary: Negative for dysuria, frequency and urgency.  Musculoskeletal: Positive for myalgias. Negative for joint pain.  Skin: Negative for rash.  Neurological: Negative for dizziness, tremors, weakness and headaches.      Objective  Vitals:   12/18/15 1100  BP: 126/77  Pulse: 73  Resp: 16  Temp: 98.2 F (36.8 C)  TempSrc: Oral  Weight: 214 lb (97.1 kg)  Height: 5\' 8"  (1.727 m)    Physical Exam  Constitutional: She is oriented to person, place, and time and well-developed, well-nourished, and in no distress. No distress.  HENT:  Head: Normocephalic and atraumatic.  Eyes: Conjunctivae are normal. Pupils are equal, round, and reactive to light. No scleral icterus.  Neck: Normal range of motion. Neck supple. Carotid bruit is not present. No thyromegaly present.  Cardiovascular: Normal rate, regular  rhythm and normal heart sounds.  Exam reveals no gallop and no friction rub.   No murmur heard. Pulmonary/Chest: Effort normal and breath sounds normal. No respiratory distress. She has no wheezes. She has no rales.  Abdominal: Soft. Bowel sounds are normal. She exhibits no distension and no mass. There is no tenderness.  Musculoskeletal: She exhibits no edema.  L  Shoulder trap tender to palpation  Lymphadenopathy:    She has no cervical adenopathy.  Neurological: She is alert and oriented to person, place, and time.       No results found for this or any previous visit (from the past 2160 hour(s)).   Assessment & Plan  Problem List Items Addressed This Visit      Cardiovascular and Mediastinum   Benign essential HTN - Primary     Endocrine   Enlargement of thyroid     Other   Dyslipidemia   Lifelong obesity   Shoulder pain, left   Relevant Medications   meloxicam (MOBIC) 15 MG tablet    Other Visit Diagnoses   None.     Meds ordered this encounter  Medications  . meloxicam (MOBIC) 15 MG tablet    Sig: Take 1 tablet (15 mg total) by mouth daily.    Dispense:  30 tablet    Refill:  3   1. Shoulder pain, left  - meloxicam (MOBIC) 15 MG tablet; Take 1 tablet (15 mg total) by mouth daily.  Dispense: 30 tablet; Refill: 3  2. Benign essential HTN Cont meds 3. Enlargement of thyroid Cont meds  4. Dyslipidemia  Cont meds 5. Lifelong obesity

## 2016-02-03 DIAGNOSIS — H35341 Macular cyst, hole, or pseudohole, right eye: Secondary | ICD-10-CM | POA: Diagnosis not present

## 2016-02-03 DIAGNOSIS — H2513 Age-related nuclear cataract, bilateral: Secondary | ICD-10-CM | POA: Diagnosis not present

## 2016-02-05 ENCOUNTER — Encounter: Payer: Self-pay | Admitting: Family Medicine

## 2016-02-13 ENCOUNTER — Other Ambulatory Visit: Payer: Self-pay | Admitting: Family Medicine

## 2016-02-13 DIAGNOSIS — Z1231 Encounter for screening mammogram for malignant neoplasm of breast: Secondary | ICD-10-CM

## 2016-03-19 ENCOUNTER — Ambulatory Visit
Admission: RE | Admit: 2016-03-19 | Discharge: 2016-03-19 | Disposition: A | Discharge status: Home / Self Care | Payer: Medicare Other | Source: Ambulatory Visit | Attending: Family Medicine | Admitting: Family Medicine

## 2016-03-19 DIAGNOSIS — Z1231 Encounter for screening mammogram for malignant neoplasm of breast: Secondary | ICD-10-CM | POA: Insufficient documentation

## 2016-05-14 ENCOUNTER — Other Ambulatory Visit: Payer: Self-pay | Admitting: Family Medicine

## 2016-05-14 MED ORDER — LEVOTHYROXINE SODIUM 25 MCG PO TABS: 25.0000 ug | ORAL_TABLET | Freq: Every day | ORAL | 11 refills | Status: DC

## 2016-07-15 ENCOUNTER — Other Ambulatory Visit: Payer: Self-pay | Admitting: Family Medicine

## 2016-07-21 DIAGNOSIS — H35341 Macular cyst, hole, or pseudohole, right eye: Secondary | ICD-10-CM | POA: Diagnosis not present

## 2016-07-21 DIAGNOSIS — H2513 Age-related nuclear cataract, bilateral: Secondary | ICD-10-CM | POA: Diagnosis not present

## 2016-08-04 ENCOUNTER — Ambulatory Visit (INDEPENDENT_AMBULATORY_CARE_PROVIDER_SITE_OTHER): Payer: Medicare Other | Admitting: Family Medicine

## 2016-08-04 ENCOUNTER — Other Ambulatory Visit: Payer: Self-pay | Admitting: Family Medicine

## 2016-08-04 ENCOUNTER — Encounter: Payer: Self-pay | Admitting: Family Medicine

## 2016-08-04 VITALS — BP 125/68 | HR 60 | Temp 97.9°F | Resp 16 | Ht 68.0 in | Wt 214.0 lb

## 2016-08-04 DIAGNOSIS — E039 Hypothyroidism, unspecified: Secondary | ICD-10-CM | POA: Diagnosis not present

## 2016-08-04 DIAGNOSIS — I10 Essential (primary) hypertension: Secondary | ICD-10-CM

## 2016-08-04 DIAGNOSIS — Z0000 Encounter for general adult medical examination without abnormal findings: Secondary | ICD-10-CM

## 2016-08-04 DIAGNOSIS — M150 Primary generalized (osteo)arthritis: Secondary | ICD-10-CM | POA: Diagnosis not present

## 2016-08-04 DIAGNOSIS — H35341 Macular cyst, hole, or pseudohole, right eye: Secondary | ICD-10-CM

## 2016-08-04 DIAGNOSIS — M159 Polyosteoarthritis, unspecified: Secondary | ICD-10-CM | POA: Insufficient documentation

## 2016-08-04 DIAGNOSIS — E785 Hyperlipidemia, unspecified: Secondary | ICD-10-CM

## 2016-08-04 DIAGNOSIS — Z131 Encounter for screening for diabetes mellitus: Secondary | ICD-10-CM

## 2016-08-04 DIAGNOSIS — E669 Obesity, unspecified: Secondary | ICD-10-CM | POA: Diagnosis not present

## 2016-08-04 MED ORDER — ATORVASTATIN CALCIUM 20 MG PO TABS
20.0000 mg | ORAL_TABLET | Freq: Every day | ORAL | 11 refills | Status: DC
Start: 2016-08-04 — End: 2017-06-14

## 2016-08-04 NOTE — Assessment & Plan Note (Signed)
Well controlled HTN No known complication  Plan: 1. Continue current meds Amlodipine  daily, HCTZ  daily, Metoprolol XL  - on BB without other indication of CAD or vascular disease, only for BP control. 2. Encouraged low sodium diet exercise 3. Check future fasting labs, follow-up 6 months annual physical

## 2016-08-04 NOTE — Assessment & Plan Note (Signed)
Stable mild shoulders and other joints, without current flare No recent x-ray imaging  Plan: 1. Encouraged to take appropriate regular dose Tylenol ext str  up to 1-2 tabs TID PRN as first line, only use NSAID if needed 2. Follow-up as needed in future, consider baseline x-rays

## 2016-08-04 NOTE — Assessment & Plan Note (Signed)
Stable chronic problem Last TSH normal 08/2015 Continue current dose Levothyroxine daily Check future fasting labs TSH in 6 months with Annual Physical

## 2016-08-04 NOTE — Assessment & Plan Note (Signed)
Chronic problem R eye Followed by Ophthalmology, requested record to be sent to our office for review

## 2016-08-04 NOTE — Patient Instructions (Signed)
Thank you for coming in to clinic today.  1.  For arthritis, aches and pains  Recommend to start taking Tylenol Extra Strength  tabs - take 1 to 2 tabs per dose (max ) every 6-8 hours for pain, max 24 hour daily dose is 6 tablets or . In the future you can repeat the same everyday Tylenol course for 1-2 weeks at a time.  - This is safe to take with anti-inflammatory medicines (Ibuprofen, Advil, Naproxen, Aleve)  2. BP is good today No med changes  You will be due for FASTING BLOOD WORK (no food or drink after midnight before, only water or coffee without cream/sugar on the morning of)  - Please go ahead and schedule a "Lab Only" visit in the morning at the clinic for lab draw in 6 months for labs before next Annual Physical - Make sure Lab Only appointment is at least 1-2 weeks before your next appointment, so that results will be available  Please schedule a follow-up appointment with Dr. Althea Charon in 6 months for Annual Physical  If you have any other questions or concerns, please feel free to call the clinic or send a message through MyChart. You may also schedule an earlier appointment if necessary.  Saralyn Pilar, DO [facility], [place]

## 2016-08-04 NOTE — Progress Notes (Signed)
Subjective:    Patient ID: Sharon Craig, female    DOB: Jul 29, 1950, 66 y.o.   MRN: [id number]  Sharon Craig is a 66 y.o. female presenting on 08/04/2016 for Hypertension   HPI   CHRONIC HTN: Reports no concerns. Not checking BP regularly outside office. Current Meds - Amlodipine  daily, HCTZ  daily, Metoprolol XL  Reports good compliance, took meds today. Tolerating well, w/o complaints. Lifestyle - Recent life stressors with husband being ill, and he went to hospital and now has follow-up with his doctor Denies CP, dyspnea, HA, edema, dizziness / lightheadedness  Hypothyroidism - Reports chronic history of hypothyroidism, had not changed dose for a while - On Levothyroxine daily - Last TSH normal 3 in 08/2015  HYPERLIPIDEMIA / OBESITY BMI >32 - Reports no concerns. Last lipid panel 11/2014, controlled on statin - Currently taking Atorvastatin  nightly, tolerating well without side effects or myalgias - Continues to exercise  Right Eye Macular Defect / History of Tension Headaches - Reports prior history of fall in 2012 and had discovered problems with her vision in R eye, she had seen Ophthalmology and diagnosed with macular hole, do not have record available. She continues to follow-up with them, able to see with corrected vision using primarily Left eye. - Additionally admits some difficulty with occasional tension headaches in temple R>L, thinks this may be related to her vision  Additional medications - Takes OTC iron supplement 2-3x weekly, only as needed. Prior history of anemia in past but nothing recent, last normal lab 11/2014  History of Osteoarthritis:  - Without significant problem no recent X-rays - Takes OTC Tylenol  x 1 dose as needed occasionally for joint pain  PMH - Fibrocystic Breast Problems with occasional sharp pains, nothing sustained  Social History  Substance Use Topics  . Smoking status: Never Smoker    . Smokeless tobacco: Never Used  . Alcohol use No    Review of Systems Per HPI unless specifically indicated above     Objective:    BP 125/68   Pulse 60   Temp 97.9 F (36.6 C) (Oral)   Resp 16   Ht  (1.727 m)   Wt 214 lb (97.1 kg)   BMI 32.54 kg/m   Wt Readings from Last 3 Encounters:  08/04/16 214 lb (97.1 kg)  12/18/15 214 lb (97.1 kg)  08/21/15 219 lb (99.3 kg)    Physical Exam  Constitutional: She is oriented to person, place, and time. She appears well-developed and well-nourished. No distress.  Well-appearing, comfortable, cooperative  HENT:  Head: Normocephalic and atraumatic.  Mouth/Throat: Oropharynx is clear and moist.  Eyes: Conjunctivae and EOM are normal.  Pupillary light response is limited R eye, difficult to appreciate abnormality  Neck: Normal range of motion. Neck supple. Thyromegaly (mild fullness of thyroid but without nodular or other abnormality) present.  Cardiovascular: Normal rate, regular rhythm, normal heart sounds and intact distal pulses.   No murmur heard. Pulmonary/Chest: Effort normal and breath sounds normal. No respiratory distress. She has no wheezes. She has no rales.  Musculoskeletal: She exhibits no edema.  Lymphadenopathy:    She has no cervical adenopathy.  Neurological: She is alert and oriented to person, place, and time.  Skin: Skin is warm and dry. No rash noted. She is not diaphoretic. No erythema.  Psychiatric: She has a normal mood and affect. Her behavior is normal.  Well groomed, good eye contact, normal speech and thoughts  Nursing note  and vitals reviewed.     Assessment & Plan:   Problem List Items Addressed This Visit    Osteoarthritis of multiple joints    Stable mild shoulders and other joints, without current flare No recent x-ray imaging  Plan: 1. Encouraged to take appropriate regular dose Tylenol ext str  up to 1-2 tabs TID PRN as first line, only use NSAID if needed 2. Follow-up as needed  in future, consider baseline x-rays      Obesity (BMI 30.0-34.9)    Weight improved down 5 lbs in almost 1 year, overall stable - No prior A1c testing available, normal glucose  Plan: 1. Encourage improve healthy lifestyle with exercise diet changes 2. Follow-up 6 months - check future fasting labs      Lamellar macular hole of right eye    Chronic problem R eye Followed by Ophthalmology, requested record to be sent to our office for review      Hypothyroidism    Stable chronic problem Last TSH normal 08/2015 Continue current dose Levothyroxine daily Check future fasting labs TSH in 6 months with Annual Physical      Dyslipidemia    Last lipids 11/2014 controlled on statin  Plan: 1. Refilled Atorvastatin  nightly #30 supply with refills 2. Check future fasting lipids 6 months, calculated ASCVD risk - consider add ASA  daily for additional primary risk reduction 3. Encouraged healthy lifestyle 4. Follow-up 6 months as planned annual physical      Relevant Medications   atorvastatin (LIPITOR) 20 MG tablet   Benign essential HTN - Primary    Well controlled HTN No known complication  Plan: 1. Continue current meds Amlodipine  daily, HCTZ  daily, Metoprolol XL  - on BB without other indication of CAD or vascular disease, only for BP control. 2. Encouraged low sodium diet exercise 3. Check future fasting labs, follow-up 6 months annual physical      Relevant Medications   atorvastatin (LIPITOR) 20 MG tablet      Meds ordered this encounter  Medications  . atorvastatin (LIPITOR) 20 MG tablet    Sig: Take 1 tablet (20 mg total) by mouth at bedtime.    Dispense:  30 tablet    Refill:  11     Follow up plan: Return in about 6 months (around 02/03/2017) for Annual Physical.  Future orders placed for labs.  Saralyn Pilar, DO [facility] 08/04/2016, 1:28 PM

## 2016-08-04 NOTE — Assessment & Plan Note (Signed)
Weight improved down 5 lbs in almost 1 year, overall stable - No prior A1c testing available, normal glucose  Plan: 1. Encourage improve healthy lifestyle with exercise diet changes 2. Follow-up 6 months - check future fasting labs

## 2016-08-04 NOTE — Assessment & Plan Note (Signed)
Last lipids 11/2014 controlled on statin  Plan: 1. Refilled Atorvastatin  nightly #30 supply with refills 2. Check future fasting lipids 6 months, calculated ASCVD risk - consider add ASA  daily for additional primary risk reduction 3. Encouraged healthy lifestyle 4. Follow-up 6 months as planned annual physical

## 2016-09-17 ENCOUNTER — Other Ambulatory Visit: Payer: Self-pay

## 2016-09-17 MED ORDER — HYDROCHLOROTHIAZIDE 25 MG PO TABS: 25.0000 mg | ORAL_TABLET | Freq: Every day | ORAL | 0 refills | Status: DC

## 2016-10-12 ENCOUNTER — Other Ambulatory Visit: Payer: Self-pay

## 2016-10-12 ENCOUNTER — Telehealth: Payer: Self-pay | Admitting: Family Medicine

## 2016-10-12 MED ORDER — AMLODIPINE BESYLATE 10 MG PO TABS: 10.0000 mg | ORAL_TABLET | Freq: Every day | ORAL | 1 refills | Status: DC

## 2016-10-12 NOTE — Telephone Encounter (Signed)
Called pt to schedule Annual Wellness Visit with NHA  - knb  °

## 2016-11-16 ENCOUNTER — Telehealth: Payer: Self-pay | Admitting: Family Medicine

## 2016-11-16 NOTE — Telephone Encounter (Signed)
Called pt to schedule for Annual Wellness Visit with Nurse Health Advisor, Tiffany Hill, my c/b # is [phone]  Kathryn Brown ° °

## 2016-11-25 ENCOUNTER — Other Ambulatory Visit: Payer: Self-pay | Admitting: Family Medicine

## 2016-11-25 MED ORDER — METOPROLOL SUCCINATE ER 25 MG PO TB24: 25.0000 mg | ORAL_TABLET | Freq: Every day | ORAL | 0 refills | Status: DC

## 2016-12-09 ENCOUNTER — Telehealth: Payer: Self-pay | Admitting: Family Medicine

## 2016-12-09 NOTE — Telephone Encounter (Signed)
Called pt to schedule for Annual Wellness Visit with Nurse Health Advisor, Tiffany Hill, my c/b # is [phone]  Sharon Craig ° °

## 2016-12-21 ENCOUNTER — Other Ambulatory Visit: Payer: Self-pay

## 2016-12-21 ENCOUNTER — Ambulatory Visit (INDEPENDENT_AMBULATORY_CARE_PROVIDER_SITE_OTHER): Payer: Medicare Other

## 2016-12-21 VITALS — BP 120/70 | HR 60 | Temp 98.0°F | Resp 16 | Ht 68.0 in | Wt 214.2 lb

## 2016-12-21 DIAGNOSIS — R7989 Other specified abnormal findings of blood chemistry: Secondary | ICD-10-CM

## 2016-12-21 DIAGNOSIS — Z1159 Encounter for screening for other viral diseases: Secondary | ICD-10-CM

## 2016-12-21 DIAGNOSIS — Z0000 Encounter for general adult medical examination without abnormal findings: Secondary | ICD-10-CM

## 2016-12-21 DIAGNOSIS — E785 Hyperlipidemia, unspecified: Secondary | ICD-10-CM

## 2016-12-21 DIAGNOSIS — E039 Hypothyroidism, unspecified: Secondary | ICD-10-CM

## 2016-12-21 DIAGNOSIS — R7309 Other abnormal glucose: Secondary | ICD-10-CM

## 2016-12-21 DIAGNOSIS — Z23 Encounter for immunization: Secondary | ICD-10-CM | POA: Diagnosis not present

## 2016-12-21 DIAGNOSIS — I10 Essential (primary) hypertension: Secondary | ICD-10-CM

## 2016-12-21 NOTE — Patient Instructions (Addendum)
Sharon Craig , Thank you for taking time to come for your Medicare Wellness Visit. I appreciate your ongoing commitment to your health goals. Please review the following plan we discussed and let me know if I can assist you in the future.   Screening recommendations/referrals: Colonoscopy: completed 12/05/2014 Mammogram: completed 03/19/2016 Bone Density: completed 12/12/2014 Recommended yearly ophthalmology/optometry visit for glaucoma screening and checkup Recommended yearly dental visit for hygiene and checkup  Vaccinations: Influenza vaccine: done today Pneumococcal vaccine: pneumovax 23 done today Tdap vaccine: due, check with your insurance company for coverage Shingles vaccine: due, check with your insurance company for coverage  Advanced directives: Advance directive discussed with you today. I have provided a copy for you to complete at home and have notarized. Once this is complete please bring a copy in to our office so we can scan it into your chart.  Conditions/risks identified: none   Next appointment: Follow up on 01/07/2017 at 9:00am with Dr.Karamalegos.  Follow up in one year for your annual wellness exam.   Preventive Care 65 Years and Older, Female Preventive care refers to lifestyle choices and visits with your health care provider that can promote health and wellness. What does preventive care include?  A yearly physical exam. This is also called an annual well check.  Dental exams once or twice a year.  Routine eye exams. Ask your health care provider how often you should have your eyes checked.  Personal lifestyle choices, including:  Daily care of your teeth and gums.  Regular physical activity.  Eating a healthy diet.  Avoiding tobacco and drug use.  Limiting alcohol use.  Practicing safe sex.  Taking low-dose aspirin every day.  Taking vitamin and mineral supplements as recommended by your health care provider. What happens during an annual  well check? The services and screenings done by your health care provider during your annual well check will depend on your age, overall health, lifestyle risk factors, and family history of disease. Counseling  Your health care provider may ask you questions about your:  Alcohol use.  Tobacco use.  Drug use.  Emotional well-being.  Home and relationship well-being.  Sexual activity.  Eating habits.  History of falls.  Memory and ability to understand (cognition).  Work and work Astronomer.  Reproductive health. Screening  You may have the following tests or measurements:  Height, weight, and BMI.  Blood pressure.  Lipid and cholesterol levels. These may be checked every 5 years, or more frequently if you are over 56 years old.  Skin check.  Lung cancer screening. You may have this screening every year starting at age 21 if you have a 30-pack-year history of smoking and currently smoke or have quit within the past 15 years.  Fecal occult blood test (FOBT) of the stool. You may have this test every year starting at age 28.  Flexible sigmoidoscopy or colonoscopy. You may have a sigmoidoscopy every 5 years or a colonoscopy every 10 years starting at age 53.  Hepatitis C blood test.  Hepatitis B blood test.  Sexually transmitted disease (STD) testing.  Diabetes screening. This is done by checking your blood sugar (glucose) after you have not eaten for a while (fasting). You may have this done every 1-3 years.  Bone density scan. This is done to screen for osteoporosis. You may have this done starting at age 3.  Mammogram. This may be done every 1-2 years. Talk to your health care provider about how often you should have  regular mammograms. Talk with your health care provider about your test results, treatment options, and if necessary, the need for more tests. Vaccines  Your health care provider may recommend certain vaccines, such as:  Influenza vaccine. This  is recommended every year.  Tetanus, diphtheria, and acellular pertussis (Tdap, Td) vaccine. You may need a Td booster every 10 years.  Zoster vaccine. You may need this after age 77.  Pneumococcal 13-valent conjugate (PCV13) vaccine. One dose is recommended after age 36.  Pneumococcal polysaccharide (PPSV23) vaccine. One dose is recommended after age 23. Talk to your health care provider about which screenings and vaccines you need and how often you need them. This information is not intended to replace advice given to you by your health care provider. Make sure you discuss any questions you have with your health care provider. Document Released: 05/02/2015 Document Revised: 12/24/2015 Document Reviewed: 02/04/2015 Elsevier Interactive Patient Education  2017 [place] Prevention in the Home Falls can cause injuries. They can happen to people of all ages. There are many things you can do to make your home safe and to help prevent falls. What can I do on the outside of my home?  Regularly fix the edges of walkways and driveways and fix any cracks.  Remove anything that might make you trip as you walk through a door, such as a raised step or threshold.  Trim any bushes or trees on the path to your home.  Use bright outdoor lighting.  Clear any walking paths of anything that might make someone trip, such as rocks or tools.  Regularly check to see if handrails are loose or broken. Make sure that both sides of any steps have handrails.  Any raised decks and porches should have guardrails on the edges.  Have any leaves, snow, or ice cleared regularly.  Use sand or salt on walking paths during winter.  Clean up any spills in your garage right away. This includes oil or grease spills. What can I do in the bathroom?  Use night lights.  Install grab bars by the toilet and in the tub and shower. Do not use towel bars as grab bars.  Use non-skid mats or decals in the tub or  shower.  If you need to sit down in the shower, use a plastic, non-slip stool.  Keep the floor dry. Clean up any water that spills on the floor as soon as it happens.  Remove soap buildup in the tub or shower regularly.  Attach bath mats securely with double-sided non-slip rug tape.  Do not have throw rugs and other things on the floor that can make you trip. What can I do in the bedroom?  Use night lights.  Make sure that you have a light by your bed that is easy to reach.  Do not use any sheets or blankets that are too big for your bed. They should not hang down onto the floor.  Have a firm chair that has side arms. You can use this for support while you get dressed.  Do not have throw rugs and other things on the floor that can make you trip. What can I do in the kitchen?  Clean up any spills right away.  Avoid walking on wet floors.  Keep items that you use a lot in easy-to-reach places.  If you need to reach something above you, use a strong step stool that has a grab bar.  Keep electrical cords out of the  way.  Do not use floor polish or wax that makes floors slippery. If you must use wax, use non-skid floor wax.  Do not have throw rugs and other things on the floor that can make you trip. What can I do with my stairs?  Do not leave any items on the stairs.  Make sure that there are handrails on both sides of the stairs and use them. Fix handrails that are broken or loose. Make sure that handrails are as long as the stairways.  Check any carpeting to make sure that it is firmly attached to the stairs. Fix any carpet that is loose or worn.  Avoid having throw rugs at the top or bottom of the stairs. If you do have throw rugs, attach them to the floor with carpet tape.  Make sure that you have a light switch at the top of the stairs and the bottom of the stairs. If you do not have them, ask someone to add them for you. What else can I do to help prevent  falls?  Wear shoes that:  Do not have high heels.  Have rubber bottoms.  Are comfortable and fit you well.  Are closed at the toe. Do not wear sandals.  If you use a stepladder:  Make sure that it is fully opened. Do not climb a closed stepladder.  Make sure that both sides of the stepladder are locked into place.  Ask someone to hold it for you, if possible.  Clearly mark and make sure that you can see:  Any grab bars or handrails.  First and last steps.  Where the edge of each step is.  Use tools that help you move around (mobility aids) if they are needed. These include:  Canes.  Walkers.  Scooters.  Crutches.  Turn on the lights when you go into a dark area. Replace any light bulbs as soon as they burn out.  Set up your furniture so you have a clear path. Avoid moving your furniture around.  If any of your floors are uneven, fix them.  If there are any pets around you, be aware of where they are.  Review your medicines with your doctor. Some medicines can make you feel dizzy. This can increase your chance of falling. Ask your doctor what other things that you can do to help prevent falls. This information is not intended to replace advice given to you by your health care provider. Make sure you discuss any questions you have with your health care provider. Document Released: 01/30/2009 Document Revised: 09/11/2015 Document Reviewed: 05/10/2014 Elsevier Interactive Patient Education  2017 [employer]vier Inc[employer]   Pneumococcal Polysaccharide Vaccine: What You Need to Know 1. Why get vaccinated? Vaccination can protect older adults (and some children and younger adults) from pneumococcal disease. Pneumococcal disease is caused by bacteria that can spread from person to person through close contact. It can cause ear infections, and it can also lead to more serious infections of the:  Lungs (pneumonia),  Blood (bacteremia), and  Covering of the brain and spinal  cord (meningitis). Meningitis can cause deafness and brain damage, and it can be fatal.  Anyone can get pneumococcal disease, but children under 452 years of age, people with certain medical conditions, adults over 68 years of age, and cigarette smokers are at the highest risk. About 18,000 older adults die each year from pneumococcal disease in the [place]. Treatment of pneumococcal infections with penicillin and other drugs used to be more  effective. But some strains of the disease have become resistant to these drugs. This makes prevention of the disease, through vaccination, even more important. 2. Pneumococcal polysaccharide vaccine (PPSV23) Pneumococcal polysaccharide vaccine (PPSV23) protects against 23 types of pneumococcal bacteria. It will not prevent all pneumococcal disease. PPSV23 is recommended for:  All adults 35 years of age and older,  Anyone 2 through 68 years of age with certain long-term health problems,  Anyone 2 through 68 years of age with a weakened immune system,  Adults 39 through 68 years of age who smoke cigarettes or have asthma.  Most people need only one dose of PPSV. A second dose is recommended for certain high-risk groups. People 45 and older should get a dose even if they have gotten one or more doses of the vaccine before they turned 65. Your healthcare provider can give you more information about these recommendations. Most healthy adults develop protection within 2 to 3 weeks of getting the shot. 3. Some people should not get this vaccine  Anyone who has had a life-threatening allergic reaction to PPSV should not get another dose.  Anyone who has a severe allergy to any component of PPSV should not receive it. Tell your provider if you have any severe allergies.  Anyone who is moderately or severely ill when the shot is scheduled may be asked to wait until they recover before getting the vaccine. Someone with a mild illness can usually be  vaccinated.  Children less than 34 years of age should not receive this vaccine.  There is no evidence that PPSV is harmful to either a pregnant woman or to her fetus. However, as a precaution, women who need the vaccine should be vaccinated before becoming pregnant, if possible. 4. Risks of a vaccine reaction With any medicine, including vaccines, there is a chance of side effects. These are usually mild and go away on their own, but serious reactions are also possible. About half of people who get PPSV have mild side effects, such as redness or pain where the shot is given, which go away within about two days. Less than 1 out of 100 people develop a fever, muscle aches, or more severe local reactions. Problems that could happen after any vaccine:  People sometimes faint after a medical procedure, including vaccination. Sitting or lying down for about 15 minutes can help prevent fainting, and injuries caused by a fall. Tell your doctor if you feel dizzy, or have vision changes or ringing in the ears.  Some people get severe pain in the shoulder and have difficulty moving the arm where a shot was given. This happens very rarely.  Any medication can cause a severe allergic reaction. Such reactions from a vaccine are very rare, estimated at about 1 in a million doses, and would happen within a few minutes to a few hours after the vaccination. As with any medicine, there is a very remote chance of a vaccine causing a serious injury or death. The safety of vaccines is always being monitored. For more information, visit: http://floyd.org/ 5. What if there is a serious reaction? What should I look for? Look for anything that concerns you, such as signs of a severe allergic reaction, very high fever, or unusual behavior. Signs of a severe allergic reaction can include hives, swelling of the face and throat, difficulty breathing, a fast heartbeat, dizziness, and weakness. These would usually  start a few minutes to a few hours after the vaccination. What should I do? If  you think it is a severe allergic reaction or other emergency that can't wait, call 9-1-1 or get to the nearest hospital. Otherwise, call your doctor. Afterward, the reaction should be reported to the Vaccine Adverse Event Reporting System (VAERS). Your doctor might file this report, or you can do it yourself through the VAERS web site at www.vaers.LAgents.no, or by calling 1-913-458-0794. VAERS does not give medical advice. 6. How can I learn more?  Ask your doctor. He or she can give you the vaccine package insert or suggest other sources of information.  Call your local or state health department.  Contact the Centers for Disease Control and Prevention (CDC): ? Call 778 704 9069 (1-800-CDC-INFO) or ? Visit CDC's website at PicCapture.uy CDC Pneumococcal Polysaccharide Vaccine VIS (08/10/13) This information is not intended to replace advice given to you by your health care provider. Make sure you discuss any questions you have with your health care provider. Document Released: 01/31/2006 Document Revised: 12/25/2015 Document Reviewed: 12/25/2015 Elsevier Interactive Patient Education  2017 [employer].

## 2016-12-21 NOTE — Progress Notes (Signed)
Subjective:   Sharon Craig is a 66 y.o. female who presents for an Initial Medicare Annual Wellness Visit.  Review of Systems     Cardiac Risk Factors include: advanced age (>66men, >44 women);obesity (BMI >30kg/m2);dyslipidemia;hypertension     Objective:    Today's Vitals   12/21/16 1044  BP: 120/70  Pulse: 60  Resp: 16  Temp: 98 F (36.7 C)  Weight: 214 lb 3.2 oz (97.2 kg)  Height: 5\' 8"  (1.727 m)   Body mass index is 32.57 kg/m.   Current Medications (verified) Outpatient Encounter Prescriptions as of 12/21/2016  Medication Sig  . amLODipine (NORVASC) 10 MG tablet Take 1 tablet (10 mg total) by mouth daily.  Marland Kitchen atorvastatin (LIPITOR) 20 MG tablet Take 1 tablet (20 mg total) by mouth at bedtime.  . cetirizine (ZYRTEC) 10 MG tablet Take 10 mg by mouth daily.  . fluticasone (FLONASE) 50 MCG/ACT nasal spray Place 2 sprays into both nostrils daily.  . hydrochlorothiazide (HYDRODIURIL) 25 MG tablet Take 1 tablet (25 mg total) by mouth daily.  Marland Kitchen levothyroxine (SYNTHROID, LEVOTHROID) 25 MCG tablet Take 1 tablet (25 mcg total) by mouth daily.  . ferrous sulfate 325 (65 FE) MG tablet Take 325 mg by mouth daily with breakfast.  . metoprolol succinate (TOPROL-XL) 25 MG 24 hr tablet Take 1 tablet (25 mg total) by mouth daily.   No facility-administered encounter medications on file as of 12/21/2016.     Allergies (verified) Hydrocodone   History: Past Medical History:  Diagnosis Date  . Hyperlipidemia   . Hypertension   . Hypothyroidism    Past Surgical History:  Procedure Laterality Date  . ABDOMINAL HYSTERECTOMY    . COLONOSCOPY WITH PROPOFOL N/A 12/05/2014   Procedure: COLONOSCOPY WITH PROPOFOL;  Surgeon: Wallace Cullens, MD;  Location: [facility] ENDOSCOPY;  Service: Gastroenterology;  Laterality: N/A;  . KNEE SURGERY Left   . TUBAL LIGATION     Family History  Problem Relation Age of Onset  . Heart failure Mother   . Hypertension Mother   . Gout Mother   . Heart  Problems Sister   . Heart Problems Brother   . Breast cancer Neg Hx    Social History   Occupational History  . Not on file.   Social History Main Topics  . Smoking status: Never Smoker  . Smokeless tobacco: Never Used  . Alcohol use No  . Drug use: No  . Sexual activity: Not on file    Tobacco Counseling Counseling given: Not Answered   Activities of Daily Living In your present state of health, do you have any difficulty performing the following activities: 12/21/2016 08/04/2016  Hearing? N N  Vision? N N  Difficulty concentrating or making decisions? Y N  Walking or climbing stairs? N N  Dressing or bathing? N N  Doing errands, shopping? N N  Preparing Food and eating ? N -  Using the Toilet? N -  In the past six months, have you accidently leaked urine? N -  Do you have problems with loss of bowel control? N -  Managing your Medications? N -  Managing your Finances? N -  Housekeeping or managing your Housekeeping? N -  Some recent data might be hidden    Immunizations and Health Maintenance Immunization History  Administered Date(s) Administered  . Influenza, High Dose Seasonal PF 03/17/2015, 12/21/2016  . Pneumococcal Polysaccharide-23 12/21/2016   There are no preventive care reminders to display for this patient.  Patient Care  Team: Smitty CordsKaramalegos, Alexander J, DO as PCP - General (Family Medicine)  Indicate any recent Medical Services you may have received from other than [facility] providers in the past year (date may be approximate).     Assessment:   This is a routine wellness examination for Sharon Craig.   Hearing/Vision screen Vision Screening Comments: Sees Dr.Woodard annually   Dietary issues and exercise activities discussed: Current Exercise Habits: The patient does not participate in regular exercise at present, Exercise limited by: None identified  Goals    None     Depression Screen PHQ 2/9 Scores 12/21/2016 08/21/2015 12/12/2014  PHQ - 2 Score 0 0 0     Fall Risk Fall Risk  12/21/2016 08/21/2015 12/12/2014  Falls in the past year? No No No    Cognitive Function:     6CIT Screen 12/21/2016  What Year? 0 points  What month? 0 points  What time? 0 points  Count back from 20 0 points  Months in reverse 0 points  Repeat phrase 0 points  Total Score 0    Screening Tests Health Maintenance  Topic Date Due  . TETANUS/TDAP  12/18/2017 (Originally 08/17/2016)  . MAMMOGRAM  03/19/2018  . COLONOSCOPY  12/04/2024  . INFLUENZA VACCINE  Completed  . DEXA SCAN  Completed  . Hepatitis C Screening  Completed  . PNA vac Low Risk Adult  Completed      Plan:    I have personally reviewed and addressed the Medicare Annual Wellness questionnaire and have noted the following in the patient's chart:  A. Medical and social history B. Use of alcohol, tobacco or illicit drugs  C. Current medications and supplements D. Functional ability and status E.  Nutritional status F.  Physical activity G. Advance directives H. List of other physicians I.  Hospitalizations, surgeries, and ER visits in previous 12 months J.  Vitals K. Screenings such as hearing and vision if needed, cognitive and depression L. Referrals and appointments   In addition, I have reviewed and discussed with patient certain preventive protocols, quality metrics, and best practice recommendations. A written personalized care plan for preventive services as well as general preventive health recommendations were provided to patient.   Signed,  Marin Robertsiffany Hill, LPN Nurse Health Advisor   MD Recommendations: none

## 2016-12-22 LAB — LIPID PANEL
Cholesterol: 152 mg/dL (ref <200)
HDL: 46 mg/dL — AB (ref >50)
LDL CALC: 88 mg/dL (ref <100)
Total CHOL/HDL Ratio: 3.3 Ratio (ref <5.0)
Triglycerides: 90 mg/dL (ref <150)
VLDL: 18 mg/dL (ref <30)

## 2016-12-22 LAB — COMPLETE METABOLIC PANEL WITH GFR
AG Ratio: 1.6 Ratio (ref 1.0–2.5)
ALBUMIN: 4.2 g/dL (ref 3.6–5.1)
ALT: 12 U/L (ref 6–29)
AST: 16 U/L (ref 10–35)
Alkaline Phosphatase: 100 U/L (ref 33–130)
BILIRUBIN TOTAL: 0.5 mg/dL (ref 0.2–1.2)
BUN/Creatinine Ratio: 24.6 Ratio — ABNORMAL HIGH (ref 6–22)
BUN: 17 mg/dL (ref 7–25)
CALCIUM: 9.2 mg/dL (ref 8.6–10.4)
CO2: 23 mmol/L (ref 20–32)
CREATININE: 0.69 mg/dL (ref 0.50–0.99)
Chloride: 103 mmol/L (ref 98–110)
GFR, Est Non African American: >89 mL/min (ref >=60)
GLOBULIN: 2.7 g/dL (ref 1.9–3.7)
GLUCOSE: 103 mg/dL — AB (ref 65–99)
Potassium: 3.6 mmol/L (ref 3.5–5.3)
Sodium: 140 mmol/L (ref 135–146)
Total Protein: 6.9 g/dL (ref 6.1–8.1)

## 2016-12-22 LAB — CBC WITH DIFFERENTIAL/PLATELET
BASOS ABS: 56 {cells}/uL (ref 0–200)
BASOS PCT: 1 %
EOS ABS: 112 {cells}/uL (ref 15–500)
Eosinophils Relative: 2 %
HEMATOCRIT: 39.9 % (ref 35.0–45.0)
Hemoglobin: 12.5 g/dL (ref 11.7–15.5)
LYMPHS PCT: 40 %
Lymphs Abs: 2240 cells/uL (ref 850–3900)
MCH: 21.9 pg — AB (ref 27.0–33.0)
MCHC: 31.3 g/dL — AB (ref 32.0–36.0)
MCV: 69.9 fL — AB (ref 80.0–100.0)
MONO ABS: 560 {cells}/uL (ref 200–950)
MONOS PCT: 10 %
MPV: 10.5 fL (ref 7.5–12.5)
NEUTROS PCT: 47 %
Neutro Abs: 2632 cells/uL (ref 1500–7800)
PLATELETS: 195 10*3/uL (ref 140–400)
RBC: 5.71 MIL/uL — ABNORMAL HIGH (ref 3.80–5.10)
RDW: 15.8 % — AB (ref 11.0–15.0)
WBC: 5.6 10*3/uL (ref 3.8–10.8)

## 2016-12-22 LAB — HEMOGLOBIN A1C
Hgb A1c MFr Bld: 5.9 % — ABNORMAL HIGH (ref <5.7)
Mean Plasma Glucose: 123 mg/dL

## 2016-12-22 LAB — HEPATITIS C ANTIBODY: HCV Ab: NONREACTIVE

## 2016-12-22 LAB — TSH: TSH: 2.62 m[IU]/L

## 2016-12-23 ENCOUNTER — Other Ambulatory Visit: Payer: Medicare Other

## 2017-01-07 ENCOUNTER — Encounter: Payer: Self-pay | Admitting: Family Medicine

## 2017-01-07 ENCOUNTER — Ambulatory Visit (INDEPENDENT_AMBULATORY_CARE_PROVIDER_SITE_OTHER): Payer: Medicare Other | Admitting: Family Medicine

## 2017-01-07 VITALS — BP 122/66 | HR 60 | Temp 98.4°F | Resp 16 | Ht 68.0 in | Wt 216.0 lb

## 2017-01-07 DIAGNOSIS — R7309 Other abnormal glucose: Secondary | ICD-10-CM | POA: Insufficient documentation

## 2017-01-07 DIAGNOSIS — E039 Hypothyroidism, unspecified: Secondary | ICD-10-CM

## 2017-01-07 DIAGNOSIS — I10 Essential (primary) hypertension: Secondary | ICD-10-CM

## 2017-01-07 DIAGNOSIS — E785 Hyperlipidemia, unspecified: Secondary | ICD-10-CM | POA: Diagnosis not present

## 2017-01-07 DIAGNOSIS — E669 Obesity, unspecified: Secondary | ICD-10-CM | POA: Diagnosis not present

## 2017-01-07 DIAGNOSIS — M25476 Effusion, unspecified foot: Secondary | ICD-10-CM | POA: Diagnosis not present

## 2017-01-07 DIAGNOSIS — M25475 Effusion, left foot: Secondary | ICD-10-CM

## 2017-01-07 DIAGNOSIS — M25473 Effusion, unspecified ankle: Secondary | ICD-10-CM

## 2017-01-07 MED ORDER — HYDROCHLOROTHIAZIDE 25 MG PO TABS: 25.0000 mg | ORAL_TABLET | Freq: Every day | ORAL | 3 refills | Status: DC

## 2017-01-07 MED ORDER — METOPROLOL SUCCINATE ER 25 MG PO TB24: 25.0000 mg | ORAL_TABLET | Freq: Every day | ORAL | 3 refills | Status: DC

## 2017-01-07 MED ORDER — LEVOTHYROXINE SODIUM 25 MCG PO TABS: 25.0000 ug | ORAL_TABLET | Freq: Every day | ORAL | 11 refills | Status: DC

## 2017-01-07 NOTE — Assessment & Plan Note (Signed)
Very well-controlled HTN - Home BP readings - none  No known complications  Plan:  1. Start to reduce BP meds - reduce Amlodipine from 10 to  daily - cut tabs in half, may reduce ankle swelling and also may not need for BP control. Will refill at  tabs in future when ready if need, also may be able to STOP completely if tolerated well - Continue HCTZ  daily, Metoprolol XL  daily 2. Encourage improved lifestyle - low sodium diet, regular exercise 3. Start monitor BP outside office, bring readings to next visit, if persistently >140/90 or new symptoms notify office sooner 4. Follow-up 6 months, sooner if needed

## 2017-01-07 NOTE — Patient Instructions (Addendum)
Thank you for coming to the clinic today.  1. Reduce Amlodipine  to HALF pill  once daily - you should have some reduced ankle and foot swelling on this lower dose.  Try to get a blood pressure cuff to keep track of it. If BP >135-140 consistently or bottom number >90, then we may need to go back to FULL pill Amlodipine   2. We will check status of your lab results  Please schedule a Follow-up Appointment to: Return in about 6 months (around 07/07/2017) for blood pressure.  If you have any other questions or concerns, please feel free to call the clinic or send a message through MyChart. You may also schedule an earlier appointment if necessary.  Additionally, you may be receiving a survey about your experience at our clinic within a few days to 1 week by e-mail or mail. We value your feedback.  Saralyn Pilar, DO [facility] [place], [place]

## 2017-01-07 NOTE — Assessment & Plan Note (Signed)
Weight stable to improved Encourage continue healthy lifestyle, diet, exercise

## 2017-01-07 NOTE — Assessment & Plan Note (Signed)
Controlled cholesterol on statin and lifestyle Last lipid panel 12/2016  Plan: 1. Continue current meds - Atorvastatin  daily 2. Encourage improved lifestyle - low carb/cholesterol, reduce portion size, continue improving regular exercise 3. Follow-up 6 months, yearly lipids

## 2017-01-07 NOTE — Assessment & Plan Note (Signed)
Stable chronic problem Last TSH normal 12/2016, stable >1-2 years Continue current dose Levothyroxine daily May check q 6-12 months

## 2017-01-07 NOTE — Progress Notes (Addendum)
Subjective:    Patient ID: Sharon Craig, female    DOB: 04-Jun-1950, 66 y.o.   MRN: [id number]  Sharon Craig is a 66 y.o. female presenting on 01/07/2017 for Annual Exam   HPI  CHRONIC HTN: Reports no concerns with BP. Does not check BP outside office, but can get a cuff now. Current Meds - Amlodipine  daily, HCTZ  daily, Metoprolol XL  daily   Reports good compliance, took meds today. Tolerating well, w/o complaints. Admits some ankle edema, see below  Elevated A1c / OBESITY BMI >32 - History of prior normal glucose labs, no prior A1c. Now recent testing A1c 5.9 - She has never had dx PreDM or DM. Her husband is DM - Not checking CBG. Never on medicine Lifestyle: - weight inc +2 lbs, some fluctuation, not checking at home - Diet (Admits increased carb snacking and ice cream, otherwise not following particular diet)  - Exercise (active with walking outdoors, walking around stores and stays active most days) Denies hypoglycemia  HYPERLIPIDEMIA: - Reports no concerns. Last lipid panel 12/2016, controlled  - Currently taking Atorvastatin  nightly, tolerating well without side effects or myalgias  Hypothyroidism - Reports chronic history of hypothyroidism. Also fam history hypothyroidism, mother - Continues on Levothyroxine daily, no recent dose changes - Last TSH 2.6 normal in 12/2016  Additional complaints - Admits had one episode of R sided scalp pain that was sharp and burning, lasted only seconds resolve with rubbing it. Has had prior R side facial pain before also improved with rubbing. - Admits some recent inc stress, with car and financial. Also with son married January 22, 2017  PMH - Traumatic injury 2011, head injury thought R eye vision change, followed by eye doctor  Health Maintenance: - UTD Flu shot 2018 - UTD Pneumonia vaccine - UTD Hep C, Colonoscopy (11/2014), Mammogram (03/2016) - Declines repeat TDap  Depression  screen [facility] 2/9 01/07/2017 12/21/2016 08/21/2015  Decreased Interest 0 0 0  Down, Depressed, Hopeless 0 0 0  PHQ - 2 Score 0 0 0    Past Medical History:  Diagnosis Date  . Hyperlipidemia   . Hypertension   . Hypothyroidism    Past Surgical History:  Procedure Laterality Date  . ABDOMINAL HYSTERECTOMY    . COLONOSCOPY WITH PROPOFOL N/A 12/05/2014   Procedure: COLONOSCOPY WITH PROPOFOL;  Surgeon: Wallace Cullens, MD;  Location: [facility] ENDOSCOPY;  Service: Gastroenterology;  Laterality: N/A;  . KNEE SURGERY Left   . TUBAL LIGATION     Social History   Social History  . Marital status: Married    Spouse name: N/A  . Number of children: N/A  . Years of education: N/A   Occupational History  . Not on file.   Social History Main Topics  . Smoking status: Never Smoker  . Smokeless tobacco: Never Used  . Alcohol use No  . Drug use: No  . Sexual activity: Not on file   Other Topics Concern  . Not on file   Social History Narrative  . No narrative on file   Family History  Problem Relation Age of Onset  . Heart failure Mother   . Hypertension Mother   . Gout Mother   . Hypothyroidism Mother   . Heart Problems Sister   . Heart Problems Brother   . Breast cancer Neg Hx    Current Outpatient Prescriptions on File Prior to Visit  Medication Sig  . amLODipine (NORVASC) 10 MG tablet Take  1 tablet (10 mg total) by mouth daily.  Marland Kitchen atorvastatin (LIPITOR) 20 MG tablet Take 1 tablet (20 mg total) by mouth at bedtime.  . cetirizine (ZYRTEC) 10 MG tablet Take 10 mg by mouth daily.  . ferrous sulfate 325 (65 FE) MG tablet Take 325 mg by mouth daily with breakfast.  . fluticasone (FLONASE) 50 MCG/ACT nasal spray Place 2 sprays into both nostrils daily.   No current facility-administered medications on file prior to visit.     Review of Systems  Constitutional: Negative for activity change, appetite change, chills, diaphoresis, fatigue, fever and unexpected weight change.  HENT: Negative  for congestion, hearing loss and sinus pressure.   Eyes: Positive for itching. Negative for discharge and visual disturbance.  Respiratory: Negative for apnea, cough, chest tightness, shortness of breath and wheezing.   Cardiovascular: Positive for leg swelling (bilateral ankle / feet only, worse at end of day or if on feed, improve elevation). Negative for chest pain and palpitations.  Gastrointestinal: Negative for abdominal pain, anal bleeding, blood in stool, constipation, diarrhea, nausea and vomiting.  Endocrine: Negative for cold intolerance and polyuria.  Genitourinary: Negative for difficulty urinating, dysuria, frequency, hematuria and urgency.  Musculoskeletal: Negative for arthralgias, back pain and neck pain.  Skin: Negative for rash.  Allergic/Immunologic: Positive for environmental allergies.  Neurological: Positive for headaches (mild rare episode burning pain on scalp, resolved). Negative for dizziness, weakness, light-headedness and numbness.  Hematological: Negative for adenopathy.  Psychiatric/Behavioral: Negative for behavioral problems, dysphoric mood and sleep disturbance. The patient is not nervous/anxious.    Per HPI unless specifically indicated above     Objective:    BP 122/66   Pulse 60   Temp 98.4 F (36.9 C) (Other (Comment))   Resp 16   Ht  (1.727 m)   Wt 216 lb (98 kg)   BMI 32.84 kg/m   Wt Readings from Last 3 Encounters:  01/07/17 216 lb (98 kg)  12/21/16 214 lb 3.2 oz (97.2 kg)  08/04/16 214 lb (97.1 kg)    Physical Exam  Constitutional: She is oriented to person, place, and time. She appears well-developed and well-nourished. No distress.  Well-appearing, comfortable, cooperative  HENT:  Head: Normocephalic and atraumatic.  Mouth/Throat: Oropharynx is clear and moist.  Frontal / maxillary sinuses non-tender. Nares patent without purulence or edema. Bilateral TMs clear without erythema, effusion or bulging. Oropharynx clear without  erythema, exudates, edema or asymmetry.  Eyes: Pupils are equal, round, and reactive to light. Conjunctivae and EOM are normal. Right eye exhibits no discharge. Left eye exhibits no discharge.  Neck: Normal range of motion. Neck supple. Thyromegaly (no nodules) present.  Cardiovascular: Normal rate, regular rhythm, normal heart sounds and intact distal pulses.   No murmur heard. No ectopy  Pulmonary/Chest: Effort normal and breath sounds normal. No respiratory distress. She has no wheezes. She has no rales.  Abdominal: Soft. Bowel sounds are normal. She exhibits no distension and no mass. There is no tenderness.  Musculoskeletal: Normal range of motion. She exhibits edema (Non pitting mild edema of bilateral ankles). She exhibits no tenderness.  Upper / Lower Extremities: - Normal muscle tone, strength bilateral upper extremities 5/5, lower extremities 5/5  Lymphadenopathy:    She has no cervical adenopathy.  Neurological: She is alert and oriented to person, place, and time.  Distal sensation intact to light touch all extremities  Skin: Skin is warm and dry. No rash noted. She is not diaphoretic. No erythema.  Psychiatric: She has  a normal mood and affect. Her behavior is normal.  Well groomed, good eye contact, normal speech and thoughts  Nursing note and vitals reviewed.  Results for orders placed or performed in visit on 02/05/16  Microalbumin, urine  Result Value Ref Range   Microalb, Ur normal   HM DIABETES FOOT EXAM  Result Value Ref Range   HM Diabetic Foot Exam normal       Assessment & Plan:   Problem List Items Addressed This Visit    Benign essential HTN - Primary    Very well-controlled HTN - Home BP readings - none  No known complications  Plan:  1. Start to reduce BP meds - reduce Amlodipine from 10 to  daily - cut tabs in half, may reduce ankle swelling and also may not need for BP control. Will refill at  tabs in future when ready if need, also may be able  to STOP completely if tolerated well - Continue HCTZ  daily, Metoprolol XL  daily 2. Encourage improved lifestyle - low sodium diet, regular exercise 3. Start monitor BP outside office, bring readings to next visit, if persistently >140/90 or new symptoms notify office sooner 4. Follow-up 6 months, sooner if needed      Relevant Medications   metoprolol succinate (TOPROL-XL) 25 MG 24 hr tablet   hydrochlorothiazide (HYDRODIURIL) 25 MG tablet   Dyslipidemia    Controlled cholesterol on statin and lifestyle Last lipid panel 12/2016  Plan: 1. Continue current meds - Atorvastatin  daily 2. Encourage improved lifestyle - low carb/cholesterol, reduce portion size, continue improving regular exercise 3. Follow-up 6 months, yearly lipids      Elevated hemoglobin A1c    Concern new elevated A1c 5.9, no results for comparison. Possible new Pre-DM  Plan:  1. Not on any therapy currently  2. Encourage improved lifestyle - low carb, low sugar diet, reduce portion size, continue improving regular exercise 3. Follow-up 6 months A1c, confirm if PreDM      Hypothyroidism    Stable chronic problem Last TSH normal 12/2016, stable >1-2 years Continue current dose Levothyroxine daily May check q 6-12 months      Relevant Medications   metoprolol succinate (TOPROL-XL) 25 MG 24 hr tablet   levothyroxine (SYNTHROID, LEVOTHROID) 25 MCG tablet   Obesity (BMI 30.0-34.9)    Weight stable to improved Encourage continue healthy lifestyle, diet, exercise       Other Visit Diagnoses    Bilateral swelling of feet and ankles       Benign appearance and unremarkable history, most likely some mild venous insufficiency but most likely amlodipine side effect, reduce amlod 10 to 5 maybe taper     #Scalp Pain, intermittent - No clear etiology, suspect more superficial or cutaneous vs nerve related pain - Reassurance, no further work-up needed at this time   Meds ordered this encounter    Medications  . metoprolol succinate (TOPROL-XL) 25 MG 24 hr tablet    Sig: Take 1 tablet (25 mg total) by mouth daily.    Dispense:  90 tablet    Refill:  3  . hydrochlorothiazide (HYDRODIURIL) 25 MG tablet    Sig: Take 1 tablet (25 mg total) by mouth daily.    Dispense:  90 tablet    Refill:  3    **Patient requests 90 days supply**  . levothyroxine (SYNTHROID, LEVOTHROID) 25 MCG tablet    Sig: Take 1 tablet (25 mcg total) by mouth daily.    Dispense:  30 tablet    Refill:  11   **NOTE** Lab results from 12/21/16 - at this time of note are not available in Epic EHR, there was problem due to change of lab company from [place] to [place]. Lab results are available and we have received fax copy, but awaiting them to be crossed over in EHR system.  Follow up plan: Return in about 6 months (around 07/07/2017) for blood pressure, A1c, Thyroid.  Saralyn Pilar, DO [facility] 01/07/2017, 12:51 PM

## 2017-01-07 NOTE — Assessment & Plan Note (Signed)
Concern new elevated A1c 5.9, no results for comparison. Possible new Pre-DM  Plan:  1. Not on any therapy currently  2. Encourage improved lifestyle - low carb, low sugar diet, reduce portion size, continue improving regular exercise 3. Follow-up 6 months A1c, confirm if PreDM

## 2017-01-09 ENCOUNTER — Other Ambulatory Visit: Payer: Self-pay | Admitting: Family Medicine

## 2017-01-09 DIAGNOSIS — I10 Essential (primary) hypertension: Secondary | ICD-10-CM

## 2017-01-11 ENCOUNTER — Other Ambulatory Visit: Payer: Self-pay | Admitting: Family Medicine

## 2017-01-11 DIAGNOSIS — Z1231 Encounter for screening mammogram for malignant neoplasm of breast: Secondary | ICD-10-CM

## 2017-01-20 ENCOUNTER — Other Ambulatory Visit: Payer: Self-pay

## 2017-01-20 MED ORDER — FLUTICASONE PROPIONATE 50 MCG/ACT NA SUSP: 2.0000 | Freq: Every day | NASAL | 3 refills | Status: DC

## 2017-02-17 ENCOUNTER — Other Ambulatory Visit: Payer: Self-pay

## 2017-02-17 ENCOUNTER — Telehealth: Payer: Self-pay | Admitting: Family Medicine

## 2017-02-17 NOTE — Telephone Encounter (Signed)
error 

## 2017-03-21 ENCOUNTER — Ambulatory Visit
Admission: RE | Admit: 2017-03-21 | Discharge: 2017-03-21 | Disposition: A | Discharge status: Home / Self Care | Payer: Medicare Other | Source: Ambulatory Visit | Attending: Family Medicine | Admitting: Family Medicine

## 2017-03-21 DIAGNOSIS — Z1231 Encounter for screening mammogram for malignant neoplasm of breast: Secondary | ICD-10-CM | POA: Diagnosis not present

## 2017-04-26 ENCOUNTER — Other Ambulatory Visit: Payer: Self-pay | Admitting: Family Medicine

## 2017-04-26 DIAGNOSIS — I10 Essential (primary) hypertension: Secondary | ICD-10-CM

## 2017-04-26 NOTE — Telephone Encounter (Signed)
Called patient back, because last visit we advised to reduce Amlodipine from 10 to 5mg , see if BP stayed stable and if ankle edema improved.  She states that she has forgotten to do this regularly, often she tried to take half pill but then she forgot for while and was back to taking whole pill. She does not know if half dose was better or not for swelling.  She has enough Amlodipine 10mg  to cut in half now for 2 weeks, she will try this for 1-2 weeks if improved ankle edema on 5mg  and if BP controlled, she will call us back within 1 week to get update and we will either refill Amlodipine 5mg  daily new lower dose or back on 10mg .  Sharon PilarAlexander Tabbetha Kutscher, DO [facility] 04/26/2017, 5:34 PM

## 2017-05-23 ENCOUNTER — Other Ambulatory Visit: Payer: Self-pay | Admitting: Family Medicine

## 2017-05-23 DIAGNOSIS — I10 Essential (primary) hypertension: Secondary | ICD-10-CM

## 2017-05-23 NOTE — Telephone Encounter (Signed)
In your previous note you document that you will send in the reduce dosage of Amlodipine or possibly discontinuing the medication.

## 2017-05-24 MED ORDER — AMLODIPINE BESYLATE 5 MG PO TABS: 5.0000 mg | ORAL_TABLET | Freq: Every day | ORAL | 1 refills | Status: DC

## 2017-05-24 NOTE — Telephone Encounter (Signed)
Reduced dose Amlodipine 10 to 5mg  daily  Saralyn PilarAlexander Karamalegos, DO [facility] 05/24/2017, 1:41 AM

## 2017-06-14 ENCOUNTER — Other Ambulatory Visit: Payer: Self-pay

## 2017-06-14 DIAGNOSIS — E039 Hypothyroidism, unspecified: Secondary | ICD-10-CM

## 2017-06-14 DIAGNOSIS — I10 Essential (primary) hypertension: Secondary | ICD-10-CM

## 2017-06-14 DIAGNOSIS — E785 Hyperlipidemia, unspecified: Secondary | ICD-10-CM

## 2017-06-14 MED ORDER — METOPROLOL SUCCINATE ER 25 MG PO TB24: ORAL_TABLET | ORAL | 3 refills | Status: DC

## 2017-06-14 MED ORDER — LEVOTHYROXINE SODIUM 25 MCG PO TABS: 25.0000 ug | ORAL_TABLET | Freq: Every day | ORAL | 0 refills | Status: DC

## 2017-06-14 MED ORDER — ATORVASTATIN CALCIUM 20 MG PO TABS: 20.0000 mg | ORAL_TABLET | Freq: Every day | ORAL | 3 refills | Status: DC

## 2017-06-16 ENCOUNTER — Other Ambulatory Visit: Payer: Self-pay | Admitting: Family Medicine

## 2017-06-16 DIAGNOSIS — I10 Essential (primary) hypertension: Secondary | ICD-10-CM

## 2017-06-16 MED ORDER — FLUTICASONE PROPIONATE 50 MCG/ACT NA SUSP: 2.0000 | Freq: Every day | NASAL | 3 refills | Status: DC

## 2017-06-16 MED ORDER — HYDROCHLOROTHIAZIDE 25 MG PO TABS
25.0000 mg | ORAL_TABLET | Freq: Every day | ORAL | 3 refills | Status: DC
Start: 2017-06-16 — End: 2018-01-21

## 2017-06-16 MED ORDER — AMLODIPINE BESYLATE 5 MG PO TABS: 5.0000 mg | ORAL_TABLET | Freq: Every day | ORAL | 1 refills | Status: DC

## 2017-07-08 ENCOUNTER — Ambulatory Visit: Payer: Medicare Other | Admitting: Family Medicine

## 2017-07-14 ENCOUNTER — Ambulatory Visit (INDEPENDENT_AMBULATORY_CARE_PROVIDER_SITE_OTHER): Payer: Medicare Other | Admitting: Family Medicine

## 2017-07-14 ENCOUNTER — Other Ambulatory Visit: Payer: Self-pay | Admitting: Family Medicine

## 2017-07-14 ENCOUNTER — Encounter: Payer: Self-pay | Admitting: Family Medicine

## 2017-07-14 VITALS — BP 116/57 | HR 55 | Temp 97.8°F | Resp 16 | Ht 68.0 in | Wt 210.0 lb

## 2017-07-14 DIAGNOSIS — E039 Hypothyroidism, unspecified: Secondary | ICD-10-CM

## 2017-07-14 DIAGNOSIS — R7309 Other abnormal glucose: Secondary | ICD-10-CM | POA: Diagnosis not present

## 2017-07-14 DIAGNOSIS — I10 Essential (primary) hypertension: Secondary | ICD-10-CM

## 2017-07-14 DIAGNOSIS — E669 Obesity, unspecified: Secondary | ICD-10-CM

## 2017-07-14 DIAGNOSIS — Z0000 Encounter for general adult medical examination without abnormal findings: Secondary | ICD-10-CM

## 2017-07-14 DIAGNOSIS — E785 Hyperlipidemia, unspecified: Secondary | ICD-10-CM

## 2017-07-14 LAB — POCT GLYCOSYLATED HEMOGLOBIN (HGB A1C): HEMOGLOBIN A1C: 5.8 — AB (ref ?–5.7)

## 2017-07-14 NOTE — Progress Notes (Signed)
Subjective:    Patient ID: Sharon Craig, female    DOB: 09/14/1950, 67 y.o.   MRN: [id number]  Sharon GirtDeborah Parker Craig is a 67 y.o. female presenting on 07/14/2017 for Hypertension   HPI  CHRONIC HTN: Reports BP has been controlled at home, since last visit lowered AMlodipine from 10 to 5 Current Meds - Amlodipine 5mg  daily, HCTZ 25mg  daily, Metoprolol XL 25mg  daily   Reports good compliance, took meds today. Tolerating well, w/o complaints. Admits some ankle edema, seems improved today Denies CP, dyspnea, HA, , dizziness / lightheadedness  Pre-DIabetes / OBESITY Recently doing well, weight down 6 lbs, increased water intake, eating more fruit. Improved A1c today 5.8 from 5.9 - Not checking CBG. Never on medicine Lifestyle - Diet (improved, see above - Exercise (active with walking outdoors, walking around stores and stays active most days) Denies hypoglycemia  Additional history L eye cataract, history of "sinus" in this eye, she uses some vick's vapor rub on temple, some eye throbbing and headaches at times, followed by    [facility] 07/14/2017 01/07/2017 12/21/2016  Decreased Interest 0 0 0  Down, Depressed, Hopeless 0 0 0  PHQ - 2 Score 0 0 0    Social History   Tobacco Use  . Smoking status: Never Smoker  . Smokeless tobacco: Never Used  Substance Use Topics  . Alcohol use: No  . Drug use: No    Review of Systems Per HPI unless specifically indicated above     Objective:    BP (!) 116/57   Pulse (!) 55   Temp 97.8 F (36.6 C) (Oral)   Resp 16   Ht 5\' 8"  (1.727 m)   Wt 210 lb (95.3 kg)   BMI 31.93 kg/m   Wt Readings from Last 3 Encounters:  07/14/17 210 lb (95.3 kg)  01/07/17 216 lb (98 kg)  12/21/16 214 lb 3.2 oz (97.2 kg)    Physical Exam  Constitutional: She is oriented to person, place, and time. She appears well-developed and well-nourished. No distress.  Well-appearing, comfortable, cooperative  HENT:  Head:  Normocephalic and atraumatic.  Mouth/Throat: Oropharynx is clear and moist.  Eyes: Conjunctivae are normal. Right eye exhibits no discharge. Left eye exhibits no discharge.  Neck: Normal range of motion. Neck supple. No thyromegaly present.  Cardiovascular: Normal rate, regular rhythm, normal heart sounds and intact distal pulses.  No murmur heard. Pulmonary/Chest: Effort normal and breath sounds normal. No respiratory distress. She has no wheezes. She has no rales.  Musculoskeletal: Normal range of motion. She exhibits no edema.  Lymphadenopathy:    She has no cervical adenopathy.  Neurological: She is alert and oriented to person, place, and time.  Skin: Skin is warm and dry. No rash noted. She is not diaphoretic. No erythema.  Psychiatric: She has a normal mood and affect. Her behavior is normal.  Well groomed, good eye contact, normal speech and thoughts  Nursing note and vitals reviewed.  Results for orders placed or performed in visit on 07/14/17  POCT HgB A1C  Result Value Ref Range   Hemoglobin A1C 5.8 (A) 5.7      Assessment & Plan:   Problem List Items Addressed This Visit    Benign essential HTN    Still well-controlled HTN, on reduced meds - Home BP readings - improved No known complications  Plan:  1. Continue current Amlodipine 5mg  daily, HCTZ 25mg  daily, Metoprolol XL 25mg  daily 2. Encourage improved lifestyle - low  sodium diet, regular exercise 3. Continue monitor BP outside office, bring readings to next visit, if persistently >140/90 or new symptoms notify office sooner 4. Follow-up 6 month, annual may reduce Amlodipine further      Elevated hemoglobin A1c - Primary    Improved A1c to 5.8, from 5.9 Suspect new dx PreDM  Plan:  1. Not on any therapy currently  2. Encourage improved lifestyle - low carb, low sugar diet, reduce portion size, continue improving regular exercise 3. Follow-up 6 months A1c      Relevant Orders   POCT HgB A1C (Completed)        No orders of the defined types were placed in this encounter.   Follow up plan: Return in about 6 months (around 01/14/2018) for Annual Physical.  Future labs ordered for 12/2017  Sharon Pilar, DO [facility] 07/14/2017, 4:41 PM

## 2017-07-14 NOTE — Assessment & Plan Note (Signed)
Still well-controlled HTN, on reduced meds - Home BP readings - improved No known complications  Plan:  1. Continue current Amlodipine 5mg  daily, HCTZ 25mg  daily, Metoprolol XL 25mg  daily 2. Encourage improved lifestyle - low sodium diet, regular exercise 3. Continue monitor BP outside office, bring readings to next visit, if persistently >140/90 or new symptoms notify office sooner 4. Follow-up 6 month, annual may reduce Amlodipine further

## 2017-07-14 NOTE — Patient Instructions (Addendum)
Thank you for coming to the office today.  Keep up the good work, sugar A1c 5.8 - improved from last time 5.9  Try to limit salt and keep drinking the water  Swelling seems somewhat improved  Follow with Eye Doctor  Review diet handout sheet to help  DUE for FASTING BLOOD WORK (no food or drink after midnight before the lab appointment, only water or coffee without cream/sugar on the morning of)  SCHEDULE "Lab Only" visit in the morning at the clinic for lab draw in 6 MONTHS   - Make sure Lab Only appointment is at about 1 week before your next appointment, so that results will be available  For Lab Results, once available within 2-3 days of blood draw, you can can log in to MyChart online to view your results and a brief explanation. Also, we can discuss results at next follow-up visit.   Please schedule a Follow-up Appointment to: Return in about 6 months (around 01/14/2018) for Annual Physical.  If you have any other questions or concerns, please feel free to call the office or send a message through MyChart. You may also schedule an earlier appointment if necessary.  Additionally, you may be receiving a survey about your experience at our office within a few days to 1 week by e-mail or mail. We value your feedback.  Saralyn PilarAlexander Shyam Dawson, DO [facility]

## 2017-07-14 NOTE — Assessment & Plan Note (Signed)
Improved A1c to 5.8, from 5.9 Suspect new dx PreDM  Plan:  1. Not on any therapy currently  2. Encourage improved lifestyle - low carb, low sugar diet, reduce portion size, continue improving regular exercise 3. Follow-up 6 months A1c

## 2017-09-22 ENCOUNTER — Other Ambulatory Visit: Payer: Self-pay | Admitting: Family Medicine

## 2017-09-22 DIAGNOSIS — E785 Hyperlipidemia, unspecified: Secondary | ICD-10-CM

## 2017-09-26 DIAGNOSIS — H2513 Age-related nuclear cataract, bilateral: Secondary | ICD-10-CM | POA: Diagnosis not present

## 2017-09-26 DIAGNOSIS — H35341 Macular cyst, hole, or pseudohole, right eye: Secondary | ICD-10-CM | POA: Diagnosis not present

## 2017-10-22 ENCOUNTER — Other Ambulatory Visit: Payer: Self-pay | Admitting: Family Medicine

## 2017-10-22 DIAGNOSIS — E039 Hypothyroidism, unspecified: Secondary | ICD-10-CM

## 2017-11-19 ENCOUNTER — Other Ambulatory Visit: Payer: Self-pay | Admitting: Family Medicine

## 2017-11-19 DIAGNOSIS — I10 Essential (primary) hypertension: Secondary | ICD-10-CM

## 2017-11-24 ENCOUNTER — Other Ambulatory Visit: Payer: Self-pay | Admitting: Family Medicine

## 2017-11-24 DIAGNOSIS — I10 Essential (primary) hypertension: Secondary | ICD-10-CM

## 2017-12-07 ENCOUNTER — Other Ambulatory Visit: Payer: Self-pay | Admitting: Family Medicine

## 2017-12-07 DIAGNOSIS — Z1231 Encounter for screening mammogram for malignant neoplasm of breast: Secondary | ICD-10-CM

## 2017-12-13 ENCOUNTER — Telehealth: Payer: Self-pay | Admitting: Family Medicine

## 2017-12-13 NOTE — Telephone Encounter (Signed)
Pt needs a [facility] [employer] order to do labs in October.

## 2018-01-02 ENCOUNTER — Other Ambulatory Visit: Payer: Self-pay | Admitting: Family Medicine

## 2018-01-02 DIAGNOSIS — E785 Hyperlipidemia, unspecified: Secondary | ICD-10-CM

## 2018-01-10 ENCOUNTER — Other Ambulatory Visit: Payer: Medicare Other

## 2018-01-17 ENCOUNTER — Ambulatory Visit (INDEPENDENT_AMBULATORY_CARE_PROVIDER_SITE_OTHER): Payer: Medicare Other

## 2018-01-17 ENCOUNTER — Other Ambulatory Visit: Payer: Medicare Other

## 2018-01-17 ENCOUNTER — Encounter: Payer: Medicare Other | Admitting: Family Medicine

## 2018-01-17 VITALS — BP 118/78 | HR 68 | Temp 97.9°F | Resp 16 | Ht 68.0 in | Wt 194.6 lb

## 2018-01-17 DIAGNOSIS — Z0000 Encounter for general adult medical examination without abnormal findings: Secondary | ICD-10-CM

## 2018-01-17 NOTE — Patient Instructions (Addendum)
Sharon Craig , Thank you for taking time to come for your Medicare Wellness Visit. I appreciate your ongoing commitment to your health goals. Please review the following plan we discussed and let me know if I can assist you in the future.   Screening recommendations/referrals: Colonoscopy: completed 12/05/2014 Mammogram: completed 03/21/2017, scheduled for 03/23/2018 Bone Density: completed 12/12/2014 Recommended yearly ophthalmology/optometry visit for glaucoma screening and checkup Recommended yearly dental visit for hygiene and checkup  Vaccinations: Influenza vaccine: due now- declined  Pneumococcal vaccine: completed series Tdap vaccine: due now, check with your insurance company for coverage  Shingles vaccine: shingrix eligible, check with your insurance company for coverage     Advanced directives: Advance directive discussed with you today. I have provided a copy for you to complete at home and have notarized. Once this is complete please bring a copy in to our office so we can scan it into your chart.  Conditions/risks identified: recommend drinking at least 6-8 glasses of water a day   Next appointment: Follow up on 01/26/2018 at 9:00am with Dr.Karamalegos. Follow up in one year for your annual wellness exam.   Preventive Care 65 Years and Older, Female Preventive care refers to lifestyle choices and visits with your health care provider that can promote health and wellness. What does preventive care include?  A yearly physical exam. This is also called an annual well check.  Dental exams once or twice a year.  Routine eye exams. Ask your health care provider how often you should have your eyes checked.  Personal lifestyle choices, including:  Daily care of your teeth and gums.  Regular physical activity.  Eating a healthy diet.  Avoiding tobacco and drug use.  Limiting alcohol use.  Practicing safe sex.  Taking low-dose aspirin every day.  Taking vitamin and  mineral supplements as recommended by your health care provider. What happens during an annual well check? The services and screenings done by your health care provider during your annual well check will depend on your age, overall health, lifestyle risk factors, and family history of disease. Counseling  Your health care provider may ask you questions about your:  Alcohol use.  Tobacco use.  Drug use.  Emotional well-being.  Home and relationship well-being.  Sexual activity.  Eating habits.  History of falls.  Memory and ability to understand (cognition).  Work and work Astronomer.  Reproductive health. Screening  You may have the following tests or measurements:  Height, weight, and BMI.  Blood pressure.  Lipid and cholesterol levels. These may be checked every 5 years, or more frequently if you are over 38 years old.  Skin check.  Lung cancer screening. You may have this screening every year starting at age 36 if you have a 30-pack-year history of smoking and currently smoke or have quit within the past 15 years.  Fecal occult blood test (FOBT) of the stool. You may have this test every year starting at age 56.  Flexible sigmoidoscopy or colonoscopy. You may have a sigmoidoscopy every 5 years or a colonoscopy every 10 years starting at age 19.  Hepatitis C blood test.  Hepatitis B blood test.  Sexually transmitted disease (STD) testing.  Diabetes screening. This is done by checking your blood sugar (glucose) after you have not eaten for a while (fasting). You may have this done every 1-3 years.  Bone density scan. This is done to screen for osteoporosis. You may have this done starting at age 50.  Mammogram. This may  be done every 1-2 years. Talk to your health care provider about how often you should have regular mammograms. Talk with your health care provider about your test results, treatment options, and if necessary, the need for more tests. Vaccines    Your health care provider may recommend certain vaccines, such as:  Influenza vaccine. This is recommended every year.  Tetanus, diphtheria, and acellular pertussis (Tdap, Td) vaccine. You may need a Td booster every 10 years.  Zoster vaccine. You may need this after age 78.  Pneumococcal 13-valent conjugate (PCV13) vaccine. One dose is recommended after age 62.  Pneumococcal polysaccharide (PPSV23) vaccine. One dose is recommended after age 35. Talk to your health care provider about which screenings and vaccines you need and how often you need them. This information is not intended to replace advice given to you by your health care provider. Make sure you discuss any questions you have with your health care provider. Document Released: 05/02/2015 Document Revised: 12/24/2015 Document Reviewed: 02/04/2015 Elsevier Interactive Patient Education  2017 [employer].  Fall Prevention in the Home Falls can cause injuries. They can happen to people of all ages. There are many things you can do to make your home safe and to help prevent falls. What can I do on the outside of my home?  Regularly fix the edges of walkways and driveways and fix any cracks.  Remove anything that might make you trip as you walk through a door, such as a raised step or threshold.  Trim any bushes or trees on the path to your home.  Use bright outdoor lighting.  Clear any walking paths of anything that might make someone trip, such as rocks or tools.  Regularly check to see if handrails are loose or broken. Make sure that both sides of any steps have handrails.  Any raised decks and porches should have guardrails on the edges.  Have any leaves, snow, or ice cleared regularly.  Use sand or salt on walking paths during winter.  Clean up any spills in your garage right away. This includes oil or grease spills. What can I do in the bathroom?  Use night lights.  Install grab bars by the toilet and in the  tub and shower. Do not use towel bars as grab bars.  Use non-skid mats or decals in the tub or shower.  If you need to sit down in the shower, use a plastic, non-slip stool.  Keep the floor dry. Clean up any water that spills on the floor as soon as it happens.  Remove soap buildup in the tub or shower regularly.  Attach bath mats securely with double-sided non-slip rug tape.  Do not have throw rugs and other things on the floor that can make you trip. What can I do in the bedroom?  Use night lights.  Make sure that you have a light by your bed that is easy to reach.  Do not use any sheets or blankets that are too big for your bed. They should not hang down onto the floor.  Have a firm chair that has side arms. You can use this for support while you get dressed.  Do not have throw rugs and other things on the floor that can make you trip. What can I do in the kitchen?  Clean up any spills right away.  Avoid walking on wet floors.  Keep items that you use a lot in easy-to-reach places.  If you need to reach something above  you, use a strong step stool that has a grab bar.  Keep electrical cords out of the way.  Do not use floor polish or wax that makes floors slippery. If you must use wax, use non-skid floor wax.  Do not have throw rugs and other things on the floor that can make you trip. What can I do with my stairs?  Do not leave any items on the stairs.  Make sure that there are handrails on both sides of the stairs and use them. Fix handrails that are broken or loose. Make sure that handrails are as long as the stairways.  Check any carpeting to make sure that it is firmly attached to the stairs. Fix any carpet that is loose or worn.  Avoid having throw rugs at the top or bottom of the stairs. If you do have throw rugs, attach them to the floor with carpet tape.  Make sure that you have a light switch at the top of the stairs and the bottom of the stairs. If you  do not have them, ask someone to add them for you. What else can I do to help prevent falls?  Wear shoes that:  Do not have high heels.  Have rubber bottoms.  Are comfortable and fit you well.  Are closed at the toe. Do not wear sandals.  If you use a stepladder:  Make sure that it is fully opened. Do not climb a closed stepladder.  Make sure that both sides of the stepladder are locked into place.  Ask someone to hold it for you, if possible.  Clearly mark and make sure that you can see:  Any grab bars or handrails.  First and last steps.  Where the edge of each step is.  Use tools that help you move around (mobility aids) if they are needed. These include:  Canes.  Walkers.  Scooters.  Crutches.  Turn on the lights when you go into a dark area. Replace any light bulbs as soon as they burn out.  Set up your furniture so you have a clear path. Avoid moving your furniture around.  If any of your floors are uneven, fix them.  If there are any pets around you, be aware of where they are.  Review your medicines with your doctor. Some medicines can make you feel dizzy. This can increase your chance of falling. Ask your doctor what other things that you can do to help prevent falls. This information is not intended to replace advice given to you by your health care provider. Make sure you discuss any questions you have with your health care provider. Document Released: 01/30/2009 Document Revised: 09/11/2015 Document Reviewed: 05/10/2014 Elsevier Interactive Patient Education  2017 [employer]

## 2018-01-17 NOTE — Progress Notes (Signed)
Subjective:   Sharon Craig is a 67 y.o. female who presents for Medicare Annual (Subsequent) preventive examination.  Review of Systems:  Cardiac Risk Factors include: advanced age (>9men, >96 women);dyslipidemia;hypertension     Objective:     Vitals: BP 118/78 (BP Location: Left Arm, Patient Position: Sitting)   Pulse 68   Temp 97.9 F (36.6 C) (Oral)   Resp 16   Ht 5\' 8"  (1.727 m)   Wt 194 lb 9.6 oz (88.3 kg)   BMI 29.59 kg/m   Body mass index is 29.59 kg/m.  Advanced Directives 01/17/2018 12/21/2016  Does Patient Have a Medical Advance Directive? No No  Would patient like information on creating a medical advance directive? Yes (MAU/Ambulatory/Procedural Areas - Information given) Yes (MAU/Ambulatory/Procedural Areas - Information given)    Tobacco Social History   Tobacco Use  Smoking Status Never Smoker  Smokeless Tobacco Never Used     Counseling given: Not Answered   Clinical Intake:  Pre-visit preparation completed: Yes  Pain : No/denies pain     Nutritional Status: BMI 25 -29 Overweight Nutritional Risks: None Diabetes: No  How often do you need to have someone help you when you read instructions, pamphlets, or other written materials from your doctor or pharmacy?: 1 - Never What is the last grade level you completed in school?: 12th grade  Interpreter Needed?: No  Information entered by :: Letha Mirabal,LPN   Past Medical History:  Diagnosis Date  . Hyperlipidemia   . Hypertension   . Hypothyroidism    Past Surgical History:  Procedure Laterality Date  . ABDOMINAL HYSTERECTOMY    . COLONOSCOPY WITH PROPOFOL N/A 12/05/2014   Procedure: COLONOSCOPY WITH PROPOFOL;  Surgeon: Wallace Cullens, MD;  Location[facility] ENDOSCOPY;  Service: Gastroenterology;  Laterality: N/A;  . KNEE SURGERY Left   . TUBAL LIGATION     Family History  Problem Relation Age of Onset  . Heart failure Mother   . Hypertension Mother   . Gout Mother   .  Hypothyroidism Mother   . Heart Problems Sister   . Heart Problems Brother   . Breast cancer Neg Hx    Social History   Socioeconomic History  . Marital status: Married    Spouse name: Not on file  . Number of children: Not on file  . Years of education: Not on file  . Highest education level: 12th grade  Occupational History  . Not on file  Social Needs  . Financial resource strain: Not hard at all  . Food insecurity:    Worry: Never true    Inability: Never true  . Transportation needs:    Medical: No    Non-medical: No  Tobacco Use  . Smoking status: Never Smoker  . Smokeless tobacco: Never Used  Substance and Sexual Activity  . Alcohol use: No  . Drug use: No  . Sexual activity: Not on file  Lifestyle  . Physical activity:    Days per week: 0 days    Minutes per session: 0 min  . Stress: Not at all  Relationships  . Social connections:    Talks on phone: More than three times a week    Gets together: More than three times a week    Attends religious service: More than 4 times per year    Active member of club or organization: No    Attends meetings of clubs or organizations: Never    Relationship status: Married  Other Topics  Concern  . Not on file  Social History Narrative  . Not on file    Outpatient Encounter Medications as of 01/17/2018  Medication Sig  . amLODipine (NORVASC) 5 MG tablet TAKE 1 TABLET(5 MG) BY MOUTH DAILY  . atorvastatin (LIPITOR) 20 MG tablet TAKE 1 TABLET(20 MG) BY MOUTH AT BEDTIME  . cetirizine (ZYRTEC) 10 MG tablet Take 10 mg by mouth daily.  . ferrous sulfate 325 (65 FE) MG tablet Take 325 mg by mouth daily with breakfast.  . fluticasone (FLONASE) 50 MCG/ACT nasal spray Place 2 sprays into both nostrils daily.  . hydrochlorothiazide (HYDRODIURIL) 25 MG tablet Take 1 tablet (25 mg total) by mouth daily.  Marland Kitchen levothyroxine (SYNTHROID, LEVOTHROID) 25 MCG tablet TAKE 1 TABLET(25 MCG) BY MOUTH DAILY  . metoprolol succinate (TOPROL-XL) 25  MG 24 hr tablet TAKE 1 TABLET(25 MG) BY MOUTH DAILY   No facility-administered encounter medications on file as of 01/17/2018.     Activities of Daily Living In your present state of health, do you have any difficulty performing the following activities: 01/17/2018  Hearing? N  Vision? Y  Comment problems with seeing out of right eye   Difficulty concentrating or making decisions? N  Walking or climbing stairs? N  Dressing or bathing? N  Doing errands, shopping? N  Preparing Food and eating ? N  Using the Toilet? N  In the past six months, have you accidently leaked urine? N  Do you have problems with loss of bowel control? N  Managing your Medications? N  Managing your Finances? N  Housekeeping or managing your Housekeeping? N  Some recent data might be hidden    Patient Care Team: Smitty Cords, DO as PCP - General (Family Medicine)    Assessment:   This is a routine wellness examination for Gwenna.  Exercise Activities and Dietary recommendations Current Exercise Habits: The patient does not participate in regular exercise at present, Exercise limited by: None identified  Goals   None     Fall Risk Fall Risk  01/17/2018 07/14/2017 12/21/2016 08/21/2015 12/12/2014  Falls in the past year? No No No No No   FALL RISK PREVENTION PERTAINING TO THE HOME:  Any stairs in or around the home WITH handrails? Yes  Home free of loose throw rugs in walkways, pet beds, electrical cords, etc? Yes  Adequate lighting in your home to reduce risk of falls? Yes   ASSISTIVE DEVICES UTILIZED TO PREVENT FALLS:  Life alert? No  Use of a cane, walker or w/c? No  Grab bars in the bathroom? Yes  Shower chair or bench in shower? No  Elevated toilet seat or a handicapped toilet? No   DME ORDERS:  DME order needed?  No   TIMED UP AND GO:  Was the test performed? Yes .  Length of time to ambulate 10 feet: 8 sec.   GAIT:  Appearance of gait: Gait stead-fast and steady  with/without the use of an assistive device.    Depression Screen PHQ 2/9 Scores 01/17/2018 07/14/2017 01/07/2017 12/21/2016  PHQ - 2 Score 0 0 0 0     Cognitive Function     6CIT Screen 01/17/2018 12/21/2016  What Year? 0 points 0 points  What month? 0 points 0 points  What time? 0 points 0 points  Count back from 20 0 points 0 points  Months in reverse 0 points 0 points  Repeat phrase 2 points 0 points  Total Score 2 0    Immunization  History  Administered Date(s) Administered  . Influenza, High Dose Seasonal PF 03/17/2015, 12/21/2016  . Pneumococcal Polysaccharide-23 12/21/2016    Qualifies for Shingles Vaccine?Yes . Due for Shingrix. Education has been provided regarding the importance of this vaccine. Pt has been advised to call insurance company to determine out of pocket expense. Advised may also receive vaccine at local pharmacy or Health Dept. Verbalized acceptance and understanding.  Tetanus: Although this vaccine is not a covered service during a Wellness Exam, does the patient still wish to receive this vaccine today?  No .  Education has been provided regarding the importance of this vaccine. Advised may receive this vaccine at local pharmacy or Health Dept. Aware to provide a copy of the vaccination record if obtained from local pharmacy or Health Dept. Verbalized acceptance and understanding.  Flu Vaccine: Due for Flu vaccine. Does the patient want to receive this vaccine today?  No . Education has been provided regarding the importance of this vaccine but still declined. Advised may receive this vaccine at local pharmacy or Health Dept. Aware to provide a copy of the vaccination record if obtained from local pharmacy or Health Dept. Verbalized acceptance and understanding.  Pneumococcal Vaccine: completed series  Screening Tests Health Maintenance  Topic Date Due  . TETANUS/TDAP  08/17/2016  . INFLUENZA VACCINE  11/17/2017  . MAMMOGRAM  03/22/2019  . COLONOSCOPY   12/04/2024  . DEXA SCAN  Completed  . Hepatitis C Screening  Completed  . PNA vac Low Risk Adult  Completed   Cancer Screenings:  Colorectal Screening: Completed 12/05/2014. Repeat every 10 years  Mammogram: Completed 03/21/2017. Repeat every year; No longer required. Scheduled for 03/23/2018  Bone Density: Completed 12/12/2014.   Lung Cancer Screening: (Low Dose CT Chest recommended if Age 90-80 years, 30 pack-year currently smoking OR have quit w/in 15years.) does not qualify.    Additional Screening:  Hepatitis C Screening: does qualify; Completed 12/21/2016  Vision Screening: Recommended annual ophthalmology exams for early detection of glaucoma and other disorders of the eye. Is the patient up to date with their annual eye exam?  Yes  Who is the provider or what is the name of the office in which the pt attends annual eye exams? Dr.Woodard   Dental Screening: Recommended annual dental exams for proper oral hygiene  Community Resource Referral:  CRR required this visit?  No      Plan:    I have personally reviewed and addressed the Medicare Annual Wellness questionnaire and have noted the following in the patient's chart:  A. Medical and social history B. Use of alcohol, tobacco or illicit drugs  C. Current medications and supplements D. Functional ability and status E.  Nutritional status F.  Physical activity G. Advance directives H. List of other physicians I.  Hospitalizations, surgeries, and ER visits in previous 12 months J.  Vitals K. Screenings such as hearing and vision if needed, cognitive and depression L. Referrals and appointments   In addition, I have reviewed and discussed with patient certain preventive protocols, quality metrics, and best practice recommendations. A written personalized care plan for preventive services as well as general preventive health recommendations were provided to patient.   Signed,  Marin Roberts, LPN Nurse Health  Advisor   Nurse Notes:none

## 2018-01-21 ENCOUNTER — Other Ambulatory Visit: Payer: Self-pay | Admitting: Family Medicine

## 2018-01-21 DIAGNOSIS — E039 Hypothyroidism, unspecified: Secondary | ICD-10-CM

## 2018-01-21 DIAGNOSIS — I10 Essential (primary) hypertension: Secondary | ICD-10-CM

## 2018-01-23 ENCOUNTER — Other Ambulatory Visit: Payer: Medicare Other

## 2018-01-23 DIAGNOSIS — I10 Essential (primary) hypertension: Secondary | ICD-10-CM

## 2018-01-23 DIAGNOSIS — Z0000 Encounter for general adult medical examination without abnormal findings: Secondary | ICD-10-CM | POA: Diagnosis not present

## 2018-01-23 DIAGNOSIS — E039 Hypothyroidism, unspecified: Secondary | ICD-10-CM | POA: Diagnosis not present

## 2018-01-23 DIAGNOSIS — R7309 Other abnormal glucose: Secondary | ICD-10-CM | POA: Diagnosis not present

## 2018-01-23 DIAGNOSIS — E785 Hyperlipidemia, unspecified: Secondary | ICD-10-CM | POA: Diagnosis not present

## 2018-01-24 LAB — COMPLETE METABOLIC PANEL WITH GFR
AG Ratio: 1.4 (calc) (ref 1.0–2.5)
ALBUMIN MSPROF: 4 g/dL (ref 3.6–5.1)
ALKALINE PHOSPHATASE (APISO): 94 U/L (ref 33–130)
ALT: 12 U/L (ref 6–29)
AST: 15 U/L (ref 10–35)
BILIRUBIN TOTAL: 0.7 mg/dL (ref 0.2–1.2)
BUN: 9 mg/dL (ref 7–25)
CHLORIDE: 101 mmol/L (ref 98–110)
CO2: 29 mmol/L (ref 20–32)
CREATININE: 0.64 mg/dL (ref 0.50–0.99)
Calcium: 9.2 mg/dL (ref 8.6–10.4)
GFR, Est African American: 106 mL/min/{1.73_m2} (ref >=60)
GFR, Est Non African American: 91 mL/min/{1.73_m2} (ref >=60)
GLUCOSE: 100 mg/dL — AB (ref 65–99)
Globulin: 2.9 g/dL (calc) (ref 1.9–3.7)
Potassium: 3.5 mmol/L (ref 3.5–5.3)
Sodium: 139 mmol/L (ref 135–146)
Total Protein: 6.9 g/dL (ref 6.1–8.1)

## 2018-01-24 LAB — CBC WITH DIFFERENTIAL/PLATELET
BASOS PCT: 1 %
Basophils Absolute: 60 cells/uL (ref 0–200)
EOS PCT: 3.9 %
Eosinophils Absolute: 234 cells/uL (ref 15–500)
HEMATOCRIT: 39.9 % (ref 35.0–45.0)
Hemoglobin: 12.4 g/dL (ref 11.7–15.5)
LYMPHS ABS: 2520 {cells}/uL (ref 850–3900)
MCH: 21.4 pg — ABNORMAL LOW (ref 27.0–33.0)
MCHC: 31.1 g/dL — ABNORMAL LOW (ref 32.0–36.0)
MCV: 68.9 fL — AB (ref 80.0–100.0)
MPV: 11.7 fL (ref 7.5–12.5)
Monocytes Relative: 10.4 %
NEUTROS ABS: 2562 {cells}/uL (ref 1500–7800)
NEUTROS PCT: 42.7 %
Platelets: 193 10*3/uL (ref 140–400)
RBC: 5.79 10*6/uL — AB (ref 3.80–5.10)
RDW: 15.1 % — AB (ref 11.0–15.0)
Total Lymphocyte: 42 %
WBC: 6 10*3/uL (ref 3.8–10.8)
WBCMIX: 624 {cells}/uL (ref 200–950)

## 2018-01-24 LAB — HEMOGLOBIN A1C
Hgb A1c MFr Bld: 6 % of total Hgb — ABNORMAL HIGH (ref <5.7)
MEAN PLASMA GLUCOSE: 126 (calc)
eAG (mmol/L): 7 (calc)

## 2018-01-24 LAB — LIPID PANEL
Cholesterol: 134 mg/dL (ref <200)
HDL: 44 mg/dL — ABNORMAL LOW (ref >50)
LDL Cholesterol (Calc): 75 mg/dL (calc)
Non-HDL Cholesterol (Calc): 90 mg/dL (calc) (ref <130)
TRIGLYCERIDES: 74 mg/dL (ref <150)
Total CHOL/HDL Ratio: 3 (calc) (ref <5.0)

## 2018-01-24 LAB — TSH: TSH: 2.34 mIU/L (ref 0.40–4.50)

## 2018-01-24 LAB — CBC MORPHOLOGY

## 2018-01-24 LAB — T4, FREE: FREE T4: 1.3 ng/dL (ref 0.8–1.8)

## 2018-01-26 ENCOUNTER — Ambulatory Visit (INDEPENDENT_AMBULATORY_CARE_PROVIDER_SITE_OTHER): Payer: Medicare Other | Admitting: Family Medicine

## 2018-01-26 ENCOUNTER — Encounter: Payer: Self-pay | Admitting: Family Medicine

## 2018-01-26 VITALS — BP 138/74 | HR 63 | Temp 97.5°F | Resp 16 | Ht 68.0 in | Wt 195.6 lb

## 2018-01-26 DIAGNOSIS — E785 Hyperlipidemia, unspecified: Secondary | ICD-10-CM | POA: Diagnosis not present

## 2018-01-26 DIAGNOSIS — I10 Essential (primary) hypertension: Secondary | ICD-10-CM

## 2018-01-26 DIAGNOSIS — R718 Other abnormality of red blood cells: Secondary | ICD-10-CM

## 2018-01-26 DIAGNOSIS — E039 Hypothyroidism, unspecified: Secondary | ICD-10-CM | POA: Diagnosis not present

## 2018-01-26 DIAGNOSIS — J0110 Acute frontal sinusitis, unspecified: Secondary | ICD-10-CM

## 2018-01-26 DIAGNOSIS — R7309 Other abnormal glucose: Secondary | ICD-10-CM

## 2018-01-26 DIAGNOSIS — Z0000 Encounter for general adult medical examination without abnormal findings: Secondary | ICD-10-CM | POA: Diagnosis not present

## 2018-01-26 DIAGNOSIS — E669 Obesity, unspecified: Secondary | ICD-10-CM

## 2018-01-26 DIAGNOSIS — J3089 Other allergic rhinitis: Secondary | ICD-10-CM

## 2018-01-26 DIAGNOSIS — T161XXA Foreign body in right ear, initial encounter: Secondary | ICD-10-CM | POA: Diagnosis not present

## 2018-01-26 MED ORDER — AZITHROMYCIN 250 MG PO TABS: ORAL_TABLET | ORAL | 0 refills | Status: DC

## 2018-01-26 NOTE — Assessment & Plan Note (Signed)
Controlled cholesterol on statin and lifestyle Last lipid panel mild low HDL  Plan: 1. Continue current meds - Atorvastatin 20mg  nightly 2. Encourage improved lifestyle - low carb/cholesterol, reduce portion size, continue improving regular exercise Follow-up yearly

## 2018-01-26 NOTE — Assessment & Plan Note (Signed)
Weight improved wt loss Encourage continue healthy lifestyle, diet, exercise

## 2018-01-26 NOTE — Progress Notes (Signed)
Subjective:    Patient ID: Sharon Craig, female    DOB: Jan 22, 1951, 67 y.o.   MRN: [id number]  Sharon Craig is a 67 y.o. female presenting on 01/26/2018 for Annual Exam   HPI   Here for Annual Physical and Lab Review.  CHRONIC HTN: ReportsBP has been controlled at home sometimes. Mostly controlled. Current Meds -Amlodipine 5mg  daily, HCTZ 25mg  daily, Metoprolol XL 25mg  daily Reports good compliance, took meds today. Tolerating well, w/o complaints. Admits some ankle edema, at times  Pre-Diabetes / Overweight BMI >29 Previous trend A1c 5.8 to 5.9, now recent labs show slight increase 6.0 - Not checking CBG. Never on medicine Lifestyle - Weight loss down 15+ lbs >6 months - Diet (recently eating more sweets, cookies ice cream and some fried foods) - Exercise (still some active with walking outdoors, walking around stores and stays active most days) Denies hypoglycemia  HYPERLIPIDEMIA: - Reports no concerns. Last lipid panel 01/2018, mostly controlled slight low HDL - Currently taking Atorvastatin 20mg  nightly, tolerating well without side effects or myalgias Lifestyle - Diet: see above  Low MCV Background history of some anemia in past, but has been normal more recently. History of chronic low MCV and RBC indices, recent lab similar to previous. Had peripheral smear morphology of RBC as well.  Hypothyroidism Chronic problem, has been stable on past TSH lab checks >3 years. Last result was normal. Continues on Levothyroxine daily  Additional complaint:  Persistent Cough / Sinusitis Reports 1 month has had a persistent cough, she recently changed air/heat and thinks it may be contributing, she has thicker yellow green phlegm, has had spells of coughing for up to 10 min, husband had similar issue as well. - She is using zyrtec, flonase regularly started back on Admits some frontal sinus pressure - Denies fevers or chills sweats, wheezing,  dyspnea, sinus pain  R Ear Foreign body She uses Q-tip and cotton balls to help clear wax out of ears. Noticed some itching within R ear. No hearing loss or pain.  Health Maintenance: Due for Flu Shot, defer today since currently ill with cough - will return  Mammogram scheduled for 03/2018 UTD Colonoscopy UTD Pneumonia vaccine  Depression screen [facility] 2/9 01/17/2018 07/14/2017 01/07/2017  Decreased Interest 0 0 0  Down, Depressed, Hopeless 0 0 0  PHQ - 2 Score 0 0 0    Past Medical History:  Diagnosis Date  . Hyperlipidemia   . Hypothyroidism    Past Surgical History:  Procedure Laterality Date  . ABDOMINAL HYSTERECTOMY    . COLONOSCOPY WITH PROPOFOL N/A 12/05/2014   Procedure: COLONOSCOPY WITH PROPOFOL;  Surgeon: Wallace Cullens, MD;  Location: [facility] ENDOSCOPY;  Service: Gastroenterology;  Laterality: N/A;  . KNEE SURGERY Left   . TUBAL LIGATION     Social History   Socioeconomic History  . Marital status: Married    Spouse name: Not on file  . Number of children: Not on file  . Years of education: Not on file  . Highest education level: 12th grade  Occupational History  . Not on file  Social Needs  . Financial resource strain: Not hard at all  . Food insecurity:    Worry: Never true    Inability: Never true  . Transportation needs:    Medical: No    Non-medical: No  Tobacco Use  . Smoking status: Never Smoker  . Smokeless tobacco: Never Used  Substance and Sexual Activity  . Alcohol use: No  .  Drug use: No  . Sexual activity: Not on file  Lifestyle  . Physical activity:    Days per week: 0 days    Minutes per session: 0 min  . Stress: Not at all  Relationships  . Social connections:    Talks on phone: More than three times a week    Gets together: More than three times a week    Attends religious service: More than 4 times per year    Active member of club or organization: No    Attends meetings of clubs or organizations: Never    Relationship status: Married   . Intimate partner violence:    Fear of current or ex partner: No    Emotionally abused: No    Physically abused: No    Forced sexual activity: No  Other Topics Concern  . Not on file  Social History Narrative  . Not on file   Family History  Problem Relation Age of Onset  . Heart failure Mother   . Hypertension Mother   . Gout Mother   . Hypothyroidism Mother   . Heart Problems Sister   . Heart Problems Brother   . Breast cancer Neg Hx    Current Outpatient Medications on File Prior to Visit  Medication Sig  . amLODipine (NORVASC) 5 MG tablet TAKE 1 TABLET(5 MG) BY MOUTH DAILY  . atorvastatin (LIPITOR) 20 MG tablet TAKE 1 TABLET(20 MG) BY MOUTH AT BEDTIME  . cetirizine (ZYRTEC) 10 MG tablet Take 10 mg by mouth daily.  . ferrous sulfate 325 (65 FE) MG tablet Take 325 mg by mouth daily with breakfast.  . fluticasone (FLONASE) 50 MCG/ACT nasal spray SHAKE LIQUID AND USE 2 SPRAYS IN EACH NOSTRIL DAILY  . hydrochlorothiazide (HYDRODIURIL) 25 MG tablet TAKE 1 TABLET BY MOUTH DAILY  . levothyroxine (SYNTHROID, LEVOTHROID) 25 MCG tablet TAKE 1 TABLET(25 MCG) BY MOUTH DAILY  . metoprolol succinate (TOPROL-XL) 25 MG 24 hr tablet TAKE 1 TABLET(25 MG) BY MOUTH DAILY   No current facility-administered medications on file prior to visit.     Review of Systems  Constitutional: Negative for activity change, appetite change, chills, diaphoresis, fatigue and fever.  HENT: Positive for congestion, postnasal drip and sinus pressure. Negative for hearing loss and sinus pain.   Eyes: Negative for visual disturbance.  Respiratory: Positive for cough. Negative for choking, chest tightness, shortness of breath and wheezing.   Cardiovascular: Negative for chest pain, palpitations and leg swelling.  Gastrointestinal: Negative for abdominal pain, anal bleeding, blood in stool, constipation, diarrhea, nausea and vomiting.  Endocrine: Negative for cold intolerance.  Genitourinary: Negative for  difficulty urinating, dysuria, frequency and hematuria.  Musculoskeletal: Negative for arthralgias, back pain and neck pain.  Skin: Negative for rash.  Allergic/Immunologic: Positive for environmental allergies.  Neurological: Negative for dizziness, weakness, light-headedness, numbness and headaches.  Hematological: Negative for adenopathy.  Psychiatric/Behavioral: Negative for behavioral problems, dysphoric mood and sleep disturbance. The patient is not nervous/anxious.    Per HPI unless specifically indicated above     Objective:    BP 138/74 (BP Location: Left Arm, Cuff Size: Normal)   Pulse 63   Temp (!) 97.5 F (36.4 C) (Oral)   Resp 16   Ht 5\' 8"  (1.727 m)   Wt 195 lb 9.6 oz (88.7 kg)   SpO2 98%   BMI 29.74 kg/m   Wt Readings from Last 3 Encounters:  01/26/18 195 lb 9.6 oz (88.7 kg)  01/17/18 194 lb 9.6 oz (88.3  kg)  07/14/17 210 lb (95.3 kg)    Physical Exam  Constitutional: She is oriented to person, place, and time. She appears well-developed and well-nourished. No distress.  Well-appearing, comfortable, cooperative  HENT:  Head: Normocephalic and atraumatic.  Mouth/Throat: Oropharynx is clear and moist.  Frontal sinuses mild-tender. Nares mostly patent without purulence or edema. Bilateral TMs clear without erythema, effusion or bulging, there is small piece of foreign body cotton within R ear canal deep, non obstructing w/o cerumen. Oropharynx clear without erythema, exudates, edema or asymmetry.  Eyes: Pupils are equal, round, and reactive to light. Conjunctivae and EOM are normal. Right eye exhibits no discharge. Left eye exhibits no discharge.  Neck: Normal range of motion. Neck supple. No thyromegaly present.  Cardiovascular: Normal rate, regular rhythm, normal heart sounds and intact distal pulses.  No murmur heard. Pulmonary/Chest: Effort normal and breath sounds normal. No respiratory distress. She has no wheezes. She has no rales.  Abdominal: Soft. Bowel  sounds are normal. She exhibits no distension and no mass. There is no tenderness.  Musculoskeletal: Normal range of motion. She exhibits no edema or tenderness.  Upper / Lower Extremities: - Normal muscle tone, strength bilateral upper extremities 5/5, lower extremities 5/5  Lymphadenopathy:    She has no cervical adenopathy.  Neurological: She is alert and oriented to person, place, and time.  Distal sensation intact to light touch all extremities  Skin: Skin is warm and dry. No rash noted. She is not diaphoretic. No erythema.  Left ear inner helix with 2 cm area of dry rough skin patch consistent with prior scar tissue abnormality.  Psychiatric: She has a normal mood and affect. Her behavior is normal.  Well groomed, good eye contact, normal speech and thoughts  Nursing note and vitals reviewed.  Results for orders placed or performed in visit on 01/23/18  T4, free  Result Value Ref Range   Free T4 1.3 0.8 - 1.8 ng/dL  TSH  Result Value Ref Range   TSH 2.34 0.40 - 4.50 mIU/L  Lipid panel  Result Value Ref Range   Cholesterol 134 <200 mg/dL   HDL 44 (L) >09 mg/dL   Triglycerides 74 <811 mg/dL   LDL Cholesterol (Calc) 75 mg/dL (calc)   Total CHOL/HDL Ratio 3.0 <5.0 (calc)   Non-HDL Cholesterol (Calc) 90 <914 mg/dL (calc)  COMPLETE METABOLIC PANEL WITH GFR  Result Value Ref Range   Glucose, Bld 100 (H) 65 - 99 mg/dL   BUN 9 7 - 25 mg/dL   Creat 7.82 9.56 - 2.13 mg/dL   GFR, Est Non African American 91 > OR = 60 mL/min/1.69m2   GFR, Est African American 106 > OR = 60 mL/min/1.76m2   BUN/Creatinine Ratio NOT APPLICABLE 6 - 22 (calc)   Sodium 139 135 - 146 mmol/L   Potassium 3.5 3.5 - 5.3 mmol/L   Chloride 101 98 - 110 mmol/L   CO2 29 20 - 32 mmol/L   Calcium 9.2 8.6 - 10.4 mg/dL   Total Protein 6.9 6.1 - 8.1 g/dL   Albumin 4.0 3.6 - 5.1 g/dL   Globulin 2.9 1.9 - 3.7 g/dL (calc)   AG Ratio 1.4 1.0 - 2.5 (calc)   Total Bilirubin 0.7 0.2 - 1.2 mg/dL   Alkaline phosphatase  (APISO) 94 33 - 130 U/L   AST 15 10 - 35 U/L   ALT 12 6 - 29 U/L  CBC with Differential/Platelet  Result Value Ref Range   WBC 6.0 3.8 - 10.8  Thousand/uL   RBC 5.79 (H) 3.80 - 5.10 Million/uL   Hemoglobin 12.4 11.7 - 15.5 g/dL   HCT 16.1 09.6 - 04.5 %   MCV 68.9 (L) 80.0 - 100.0 fL   MCH 21.4 (L) 27.0 - 33.0 pg   MCHC 31.1 (L) 32.0 - 36.0 g/dL   RDW 40.9 (H) 81.1 - 91.4 %   Platelets 193 140 - 400 Thousand/uL   MPV 11.7 7.5 - 12.5 fL   Neutro Abs 2,562 1,500 - 7,800 cells/uL   Lymphs Abs 2,520 850 - 3,900 cells/uL   WBC mixed population 624 200 - 950 cells/uL   Eosinophils Absolute 234 15 - 500 cells/uL   Basophils Absolute 60 0 - 200 cells/uL   Neutrophils Relative % 42.7 %   Total Lymphocyte 42.0 %   Monocytes Relative 10.4 %   Eosinophils Relative 3.9 %   Basophils Relative 1.0 %  Hemoglobin A1c  Result Value Ref Range   Hgb A1c MFr Bld 6.0 (H) <5.7 % of total Hgb   Mean Plasma Glucose 126 (calc)   eAG (mmol/L) 7.0 (calc)  CBC MORPHOLOGY  Result Value Ref Range   CBC MORPHOLOGY  NORMAL      Assessment & Plan:   Problem List Items Addressed This Visit    Benign essential HTN    Controlled HTN overall - Home BP readings - stable No known complications  Plan:  1. Continue current Amlodipine 5mg  daily, HCTZ 25mg  daily, Metoprolol XL 25mg  daily 2. Encourage improved lifestyle - low sodium diet, regular exercise 3. Continue monitor BP outside office, bring readings to next visit, if persistently >140/90 or new symptoms notify office sooner 4. Follow-up 6 months      Dyslipidemia    Controlled cholesterol on statin and lifestyle Last lipid panel mild low HDL  Plan: 1. Continue current meds - Atorvastatin 20mg  nightly 2. Encourage improved lifestyle - low carb/cholesterol, reduce portion size, continue improving regular exercise Follow-up yearly       Elevated hemoglobin A1c    Slightly elevated A1c to 6.0, previous 5.8 to 5.9, attributed to  dietary Consistent with new dx PreDM  Plan:  1. Not on any therapy currently  2. Encourage improved lifestyle - has goal to change poor diet habits now - low carb, low sugar diet, reduce portion size, continue improving regular exercise 3. Follow-up 6 months PreDM A1c      Hypothyroidism    Stable chronic problem Last TSH normal 01/2018 Continue current dose Levothyroxine daily Check yearly      Low mean corpuscular volume (MCV)    Clinically chronic issue identified on CBC also had peripheral smear morphology Seems to have stable Hemoglobin without anemia or other concerns or symptoms Reassurance Follow-up yearly CBC, future if new concern can consider Hematology, seems more likely genetic factor      Obesity (BMI 30.0-34.9)    Weight improved wt loss Encourage continue healthy lifestyle, diet, exercise      Seasonal allergic rhinitis    Suspected etiology for some of her sinus symptoms, see A&P Continue anti histamine and flonase       Other Visit Diagnoses    Annual physical exam    -  Primary  Updated Health Maintenance information - Return for Flu Shot Reviewed recent lab results with patient Encouraged improvement to lifestyle with diet and exercise - Goal of weight loss    Acute non-recurrent frontal sinusitis     Consistent with subacute frontal vs rhinosinusitis, likely  initially viral URI vs allergic rhinitis component with worsening concern for bacterial infection given second sickening and productive cough now, without pulm findings today, clear lungs  Plan: 1. Start Azithromycin Z pak (antibiotic) 2 tabs day 1, then 1 tab x 4 days, complete entire course even if improved - Continue anti histamine and Flonase regularly - Use OTC cough med and mucinex, offered Tessalon PRN Return criteria reviewed      Relevant Medications   azithromycin (ZITHROMAX Z-PAK) 250 MG tablet   Foreign body of right ear, initial encounter     Small piece of cotton  within R ear canal, non obstructing without cerumen  Flushed briefly with water only and it was removed without manipulation       Meds ordered this encounter  Medications  . azithromycin (ZITHROMAX Z-PAK) 250 MG tablet    Sig: Take 2 tabs (500mg  total) on Day 1. Take 1 tab (250mg ) daily for next 4 days.    Dispense:  6 tablet    Refill:  0    Follow up plan: Return in about 6 months (around 07/28/2018) for PreDM A1c, HTN.  Saralyn Pilar, DO [facility] 01/26/2018, 9:58 AM

## 2018-01-26 NOTE — Assessment & Plan Note (Signed)
Slightly elevated A1c to 6.0, previous 5.8 to 5.9, attributed to dietary Consistent with new dx PreDM  Plan:  1. Not on any therapy currently  2. Encourage improved lifestyle - has goal to change poor diet habits now - low carb, low sugar diet, reduce portion size, continue improving regular exercise 3. Follow-up 6 months PreDM A1c

## 2018-01-26 NOTE — Patient Instructions (Addendum)
Thank you for coming to the office today.  Small piece of cotton / qtip in R ear, will flush out today.  Start Azithromycin Z pak (antibiotic) 2 tabs day 1, then 1 tab x 4 days, complete entire course even if improved  Call if need other cough medicine.  Continue Flonase  May try OTC Mucinex (or may try Mucinex-DM for cough) up to 7-10 days then stop  1. Chemistry - Normal results, including electrolytes, kidney and liver function. Normal fasting blood sugar   2. Hemoglobin A1c (Diabetes screening) - 6.0, similar to previous 5.8 to 5.9, slightly elevated, in range of Pre-Diabetes (>5.7 to 6.4)   3. TSH Thyroid Function Tests - Normal. Continue current dose Levothyroxine daily  4. Cholesterol - Normal cholesterol results. Well controlled on current atorvastatin  5. CBC Blood Counts - Mostly Normal, but still low MCV and peripheral smear shows some abnormal red blood cell shapes/size, likely causing this, otherwise no other abnormality.   Please schedule a Follow-up Appointment to: Return in about 6 months (around 07/28/2018) for PreDM A1c, HTN.  If you have any other questions or concerns, please feel free to call the office or send a message through MyChart. You may also schedule an earlier appointment if necessary.  Additionally, you may be receiving a survey about your experience at our office within a few days to 1 week by e-mail or mail. We value your feedback.  Saralyn Pilar, DO [facility] [place], [place]

## 2018-01-26 NOTE — Assessment & Plan Note (Signed)
Clinically chronic issue identified on CBC also had peripheral smear morphology Seems to have stable Hemoglobin without anemia or other concerns or symptoms Reassurance Follow-up yearly CBC, future if new concern can consider Hematology, seems more likely genetic factor 

## 2018-01-26 NOTE — Assessment & Plan Note (Signed)
Stable chronic problem Last TSH normal 01/2018 Continue current dose Levothyroxine daily Check yearly

## 2018-01-26 NOTE — Assessment & Plan Note (Signed)
Controlled HTN overall - Home BP readings - stable No known complications  Plan:  1. Continue current Amlodipine 5mg daily, HCTZ 25mg daily, Metoprolol XL 25mg daily 2. Encourage improved lifestyle - low sodium diet, regular exercise 3. Continue monitor BP outside office, bring readings to next visit, if persistently >140/90 or new symptoms notify office sooner 4. Follow-up 6 months 

## 2018-01-26 NOTE — Assessment & Plan Note (Signed)
Suspected etiology for some of her sinus symptoms, see A&P Continue anti histamine and flonase

## 2018-03-03 ENCOUNTER — Ambulatory Visit: Payer: Medicare Other

## 2018-03-23 ENCOUNTER — Ambulatory Visit
Admission: RE | Admit: 2018-03-23 | Discharge: 2018-03-23 | Disposition: A | Discharge status: Home / Self Care | Payer: Medicare Other | Source: Ambulatory Visit | Attending: Family Medicine | Admitting: Family Medicine

## 2018-03-23 DIAGNOSIS — Z1231 Encounter for screening mammogram for malignant neoplasm of breast: Secondary | ICD-10-CM | POA: Diagnosis not present

## 2018-04-27 ENCOUNTER — Ambulatory Visit: Payer: Medicare Other

## 2018-04-27 ENCOUNTER — Ambulatory Visit (INDEPENDENT_AMBULATORY_CARE_PROVIDER_SITE_OTHER): Payer: Medicare Other

## 2018-04-27 DIAGNOSIS — Z23 Encounter for immunization: Secondary | ICD-10-CM

## 2018-05-17 ENCOUNTER — Other Ambulatory Visit: Payer: Self-pay | Admitting: Family Medicine

## 2018-05-17 DIAGNOSIS — I10 Essential (primary) hypertension: Secondary | ICD-10-CM

## 2018-06-29 ENCOUNTER — Ambulatory Visit: Payer: Medicare Other | Admitting: Family Medicine

## 2018-07-07 ENCOUNTER — Ambulatory Visit: Payer: Medicare Other | Admitting: Family Medicine

## 2018-07-10 ENCOUNTER — Other Ambulatory Visit: Payer: Self-pay | Admitting: Family Medicine

## 2018-07-10 DIAGNOSIS — E039 Hypothyroidism, unspecified: Secondary | ICD-10-CM

## 2018-08-18 ENCOUNTER — Other Ambulatory Visit: Payer: Self-pay | Admitting: Family Medicine

## 2018-08-18 DIAGNOSIS — I10 Essential (primary) hypertension: Secondary | ICD-10-CM

## 2018-08-26 ENCOUNTER — Other Ambulatory Visit: Payer: Self-pay | Admitting: Family Medicine

## 2018-08-26 DIAGNOSIS — I10 Essential (primary) hypertension: Secondary | ICD-10-CM

## 2018-10-11 ENCOUNTER — Other Ambulatory Visit: Payer: Self-pay | Admitting: Family Medicine

## 2018-10-11 DIAGNOSIS — I10 Essential (primary) hypertension: Secondary | ICD-10-CM

## 2018-11-11 ENCOUNTER — Other Ambulatory Visit: Payer: Self-pay | Admitting: Family Medicine

## 2018-11-11 DIAGNOSIS — I10 Essential (primary) hypertension: Secondary | ICD-10-CM

## 2019-01-01 ENCOUNTER — Other Ambulatory Visit: Payer: Self-pay | Admitting: Family Medicine

## 2019-01-01 DIAGNOSIS — E785 Hyperlipidemia, unspecified: Secondary | ICD-10-CM

## 2019-01-08 ENCOUNTER — Telehealth: Payer: Self-pay

## 2019-01-08 DIAGNOSIS — I10 Essential (primary) hypertension: Secondary | ICD-10-CM

## 2019-01-08 DIAGNOSIS — E039 Hypothyroidism, unspecified: Secondary | ICD-10-CM

## 2019-01-08 DIAGNOSIS — Z0000 Encounter for general adult medical examination without abnormal findings: Secondary | ICD-10-CM

## 2019-01-08 DIAGNOSIS — E66811 Obesity, class 1: Secondary | ICD-10-CM

## 2019-01-08 DIAGNOSIS — E669 Obesity, unspecified: Secondary | ICD-10-CM

## 2019-01-08 DIAGNOSIS — R7309 Other abnormal glucose: Secondary | ICD-10-CM

## 2019-01-08 NOTE — Telephone Encounter (Signed)
Labs ordered for 10/23  Sharon Craig, [facility] 01/08/2019, 12:17 PM

## 2019-01-12 DIAGNOSIS — H35341 Macular cyst, hole, or pseudohole, right eye: Secondary | ICD-10-CM | POA: Diagnosis not present

## 2019-01-12 DIAGNOSIS — H2513 Age-related nuclear cataract, bilateral: Secondary | ICD-10-CM | POA: Diagnosis not present

## 2019-01-22 ENCOUNTER — Other Ambulatory Visit: Payer: Self-pay | Admitting: Family Medicine

## 2019-01-22 DIAGNOSIS — E039 Hypothyroidism, unspecified: Secondary | ICD-10-CM

## 2019-01-22 DIAGNOSIS — I10 Essential (primary) hypertension: Secondary | ICD-10-CM

## 2019-01-23 ENCOUNTER — Ambulatory Visit (INDEPENDENT_AMBULATORY_CARE_PROVIDER_SITE_OTHER): Payer: Medicare Other

## 2019-01-23 DIAGNOSIS — Z1231 Encounter for screening mammogram for malignant neoplasm of breast: Secondary | ICD-10-CM

## 2019-01-23 DIAGNOSIS — Z0000 Encounter for general adult medical examination without abnormal findings: Secondary | ICD-10-CM | POA: Diagnosis not present

## 2019-01-23 NOTE — Progress Notes (Signed)
Subjective:   Sharon Craig is a 68 y.o. female who presents for Medicare Annual (Subsequent) preventive examination.  This visit is being conducted via phone call  - after an attmept to do on video chat - due to the COVID-19 pandemic. This patient has given me verbal consent via phone to conduct this visit, patient states they are participating from their home address. Some vital signs may be absent or patient reported.   Patient identification: identified by name, DOB, and current address.    Review of Systems:   Cardiac Risk Factors include: advanced age (>5555men, 76>65 women);hypertension;dyslipidemia     Objective:     Vitals: There were no vitals taken for this visit.  There is no height or weight on file to calculate BMI.  Advanced Directives 01/23/2019 01/23/2019 01/17/2018 12/21/2016  Does Patient Have a Medical Advance Directive? No No No No  Would patient like information on creating a medical advance directive? - - Yes (MAU/Ambulatory/Procedural Areas - Information given) Yes (MAU/Ambulatory/Procedural Areas - Information given)    Tobacco Social History   Tobacco Use  Smoking Status Never Smoker  Smokeless Tobacco Never Used     Counseling given: Not Answered   Clinical Intake:  Pre-visit preparation completed: Yes  Pain : No/denies pain     Nutritional Risks: None Diabetes: No  How often do you need to have someone help you when you read instructions, pamphlets, or other written materials from your doctor or pharmacy?: 1 - Never  Interpreter Needed?: No  Information entered by :: Tracy Gerken,LPN  Past Medical History:  Diagnosis Date  . Hyperlipidemia   . Hypothyroidism    Past Surgical History:  Procedure Laterality Date  . ABDOMINAL HYSTERECTOMY    . COLONOSCOPY WITH PROPOFOL N/A 12/05/2014   Procedure: COLONOSCOPY WITH PROPOFOL;  Surgeon: Wallace CullensPaul Y Oh, MD;  [facility];  Service: Gastroenterology;  Laterality: N/A;  . KNEE  SURGERY Left   . TUBAL LIGATION     Family History  Problem Relation Age of Onset  . Heart failure Mother   . Hypertension Mother   . Gout Mother   . Hypothyroidism Mother   . Heart Problems Sister   . Heart Problems Brother   . Breast cancer Neg Hx    Social History   Socioeconomic History  . Marital status: Married    Spouse name: Not on file  . Number of children: Not on file  . Years of education: Not on file  . Highest education level: 12th grade  Occupational History  . Not on file  Social Needs  . Financial resource strain: Not hard at all  . Food insecurity    Worry: Never true    Inability: Never true  . Transportation needs    Medical: No    Non-medical: No  Tobacco Use  . Smoking status: Never Smoker  . Smokeless tobacco: Never Used  Substance and Sexual Activity  . Alcohol use: No  . Drug use: No  . Sexual activity: Not on file  Lifestyle  . Physical activity    Days per week: 0 days    Minutes per session: 0 min  . Stress: Not at all  Relationships  . Social connections    Talks on phone: More than three times a week    Gets together: More than three times a week    Attends religious service: More than 4 times per year    Active member of club or organization: No  Attends meetings of clubs or organizations: Never    Relationship status: Married  Other Topics Concern  . Not on file  Social History Narrative  . Not on file    Outpatient Encounter Medications as of 01/23/2019  Medication Sig  . amLODipine (NORVASC) 5 MG tablet TAKE 1 TABLET(5 MG) BY MOUTH DAILY  . atorvastatin (LIPITOR) 20 MG tablet TAKE 1 TABLET(20 MG) BY MOUTH AT BEDTIME  . cetirizine (ZYRTEC) 10 MG tablet Take 10 mg by mouth daily.  . fluticasone (FLONASE) 50 MCG/ACT nasal spray SHAKE LIQUID AND USE 2 SPRAYS IN EACH NOSTRIL DAILY  . hydrochlorothiazide (HYDRODIURIL) 25 MG tablet TAKE 1 TABLET BY MOUTH DAILY  . levothyroxine (SYNTHROID) 25 MCG tablet TAKE 1 TABLET(25 MCG)  BY MOUTH DAILY  . metoprolol succinate (TOPROL-XL) 25 MG 24 hr tablet TAKE 1 TABLET(25 MG) BY MOUTH DAILY  . ferrous sulfate 325 (65 FE) MG tablet Take 325 mg by mouth daily with breakfast.  . [DISCONTINUED] azithromycin (ZITHROMAX Z-PAK) 250 MG tablet Take 2 tabs (500mg  total) on Day 1. Take 1 tab (250mg ) daily for next 4 days.   No facility-administered encounter medications on file as of 01/23/2019.     Activities of Daily Living In your present state of health, do you have any difficulty performing the following activities: 01/23/2019  Hearing? N  Comment no hearing aids  Vision? Y  Comment ongoing issuses with right eye, eyeglasses, dr Ellin Mayhew annually  Difficulty concentrating or making decisions? N  Walking or climbing stairs? N  Dressing or bathing? N  Doing errands, shopping? N  Preparing Food and eating ? N  Using the Toilet? N  In the past six months, have you accidently leaked urine? N  Do you have problems with loss of bowel control? N  Managing your Medications? N  Managing your Finances? N  Housekeeping or managing your Housekeeping? N  Some recent data might be hidden    Patient Care Team: Olin Hauser, DO as PCP - General (Family Medicine)    Assessment:   This is a routine wellness examination for Sharon Craig.  Exercise Activities and Dietary recommendations Current Exercise Habits: The patient does not participate in regular exercise at present;Home exercise routine(dancing at home), Type of exercise: walking, Time (Minutes): 30, Frequency (Times/Week): 1, Weekly Exercise (Minutes/Week): 30, Intensity: Mild, Exercise limited by: None identified  Goals   None     Fall Risk: Fall Risk  01/23/2019 01/17/2018 07/14/2017 12/21/2016 08/21/2015  Falls in the past year? 0 No No No No  Number falls in past yr: 0 - - - -  Injury with Fall? 0 - - - -    FALL RISK PREVENTION PERTAINING TO THE HOME:  Any stairs in or around the home? Yes  couple steps in front  yard  If so, are there any without handrails? No   Home free of loose throw rugs in walkways, pet beds, electrical cords, etc? Yes  Adequate lighting in your home to reduce risk of falls? Yes   ASSISTIVE DEVICES UTILIZED TO PREVENT FALLS:  Life alert? No  Use of a cane, walker or w/c? No  Grab bars in the bathroom? No  Shower chair or bench in shower? No  Elevated toilet seat or a handicapped toilet? No   DME ORDERS:  DME order needed?  No   TIMED UP AND GO:  Unable to perform    Depression Screen [facility] 2/9 Scores 01/23/2019 01/17/2018 07/14/2017 01/07/2017  PHQ - 2 Score 0  0 0 0     Cognitive Function     6CIT Screen 01/17/2018 12/21/2016  What Year? 0 points 0 points  What month? 0 points 0 points  What time? 0 points 0 points  Count back from 20 0 points 0 points  Months in reverse 0 points 0 points  Repeat phrase 2 points 0 points  Total Score 2 0    Immunization History  Administered Date(s) Administered  . Influenza, High Dose Seasonal PF 03/17/2015, 12/21/2016, 04/27/2018  . Pneumococcal Polysaccharide-23 12/21/2016    Qualifies for Shingles Vaccine? Yes  Zostavax completed n/a. Due for Shingrix. Education has been provided regarding the importance of this vaccine. Pt has been advised to call insurance company to determine out of pocket expense. Advised may also receive vaccine at local pharmacy or Health Dept. Verbalized acceptance and understanding.  Tdap: up to date   Flu Vaccine: Due for Flu vaccine. Will get next week in office   Pneumococcal Vaccine: up to date   Screening Tests Health Maintenance  Topic Date Due  . INFLUENZA VACCINE  11/18/2018  . TETANUS/TDAP  01/27/2019 (Originally 08/17/2016)  . MAMMOGRAM  03/23/2020  . COLONOSCOPY  12/04/2024  . DEXA SCAN  Completed  . Hepatitis C Screening  Completed  . PNA vac Low Risk Adult  Completed    Cancer Screenings:  Colorectal Screening: Completed 12/05/2014. Repeat every 10 years  Mammogram:  Completed 03/23/2018. Repeat every year  Bone Density: Completed 12/12/2014.  Lung Cancer Screening: (Low Dose CT Chest recommended if Age 18-80 years, 30 pack-year currently smoking OR have quit w/in 15years.) does not qualify.    Additional Screening:  Hepatitis C Screening: does qualify  Vision Screening: Recommended annual ophthalmology exams for early detection of glaucoma and other disorders of the eye. Is the patient up to date with their annual eye exam?  Yes  Who is the provider or what is the name of the office in which the pt attends annual eye exams? Dr.woodard    Dental Screening: Recommended annual dental exams for proper oral hygiene  Community Resource Referral:  CRR required this visit?  Yes, needs stair railing fixed or ramp in front stairs.     Plan:  I have personally reviewed and addressed the Medicare Annual Wellness questionnaire and have noted the following in the patient's chart:  A. Medical and social history B. Use of alcohol, tobacco or illicit drugs  C. Current medications and supplements D. Functional ability and status E.  Nutritional status F.  Physical activity G. Advance directives H. List of other physicians I.  Hospitalizations, surgeries, and ER visits in previous 12 months J.  Vitals K. Screenings such as hearing and vision if needed, cognitive and depression L. Referrals and appointments   In addition, I have reviewed and discussed with patient certain preventive protocols, quality metrics, and best practice recommendations. A written personalized care plan for preventive services as well as general preventive health recommendations were provided to patient.  Signed,    Collene Schlichter, LPN  [id number] Nurse Health Advisor   Nurse Notes: none

## 2019-01-23 NOTE — Patient Instructions (Signed)
Sharon Craig , Thank you for taking time to come for your Medicare Wellness Visit. I appreciate your ongoing commitment to your health goals. Please review the following plan we discussed and let me know if I can assist you in the future.   Screening recommendations/referrals: Colonoscopy: up to date Mammogram: Please call [phone] to schedule your mammogram.  Bone Density: up to date Recommended yearly ophthalmology/optometry visit for glaucoma screening and checkup Recommended yearly dental visit for hygiene and checkup  Vaccinations: Influenza vaccine: will get at next in office visit  Pneumococcal vaccine: up to date Tdap vaccine: due, check with your insurance for coverage Shingles vaccine: shingrix eligible     Advanced directives: Advance directive discussed with you today. I have provided a copy for you to complete at home and have notarized. Once this is complete please bring a copy in to our office so we can scan it into your chart.  Conditions/risks identified: Someone will contact you in regards to stair railing/ ramp   Next appointment: follow up in one year for your annual wellness visit   Preventive Care 65 Years and Older, Female Preventive care refers to lifestyle choices and visits with your health care provider that can promote health and wellness. What does preventive care include?  A yearly physical exam. This is also called an annual well check.  Dental exams once or twice a year.  Routine eye exams. Ask your health care provider how often you should have your eyes checked.  Personal lifestyle choices, including:  Daily care of your teeth and gums.  Regular physical activity.  Eating a healthy diet.  Avoiding tobacco and drug use.  Limiting alcohol use.  Practicing safe sex.  Taking low-dose aspirin every day.  Taking vitamin and mineral supplements as recommended by your health care provider. What happens during an annual well check? The  services and screenings done by your health care provider during your annual well check will depend on your age, overall health, lifestyle risk factors, and family history of disease. Counseling  Your health care provider may ask you questions about your:  Alcohol use.  Tobacco use.  Drug use.  Emotional well-being.  Home and relationship well-being.  Sexual activity.  Eating habits.  History of falls.  Memory and ability to understand (cognition).  Work and work Statistician.  Reproductive health. Screening  You may have the following tests or measurements:  Height, weight, and BMI.  Blood pressure.  Lipid and cholesterol levels. These may be checked every 5 years, or more frequently if you are over 42 years old.  Skin check.  Lung cancer screening. You may have this screening every year starting at age 24 if you have a 30-pack-year history of smoking and currently smoke or have quit within the past 15 years.  Fecal occult blood test (FOBT) of the stool. You may have this test every year starting at age 43.  Flexible sigmoidoscopy or colonoscopy. You may have a sigmoidoscopy every 5 years or a colonoscopy every 10 years starting at age 59.  Hepatitis C blood test.  Hepatitis B blood test.  Sexually transmitted disease (STD) testing.  Diabetes screening. This is done by checking your blood sugar (glucose) after you have not eaten for a while (fasting). You may have this done every 1-3 years.  Bone density scan. This is done to screen for osteoporosis. You may have this done starting at age 41.  Mammogram. This may be done every 1-2 years. Talk to your  health care provider about how often you should have regular mammograms. Talk with your health care provider about your test results, treatment options, and if necessary, the need for more tests. Vaccines  Your health care provider may recommend certain vaccines, such as:  Influenza vaccine. This is recommended  every year.  Tetanus, diphtheria, and acellular pertussis (Tdap, Td) vaccine. You may need a Td booster every 10 years.  Zoster vaccine. You may need this after age 90.  Pneumococcal 13-valent conjugate (PCV13) vaccine. One dose is recommended after age 36.  Pneumococcal polysaccharide (PPSV23) vaccine. One dose is recommended after age 61. Talk to your health care provider about which screenings and vaccines you need and how often you need them. This information is not intended to replace advice given to you by your health care provider. Make sure you discuss any questions you have with your health care provider. Document Released: 05/02/2015 Document Revised: 12/24/2015 Document Reviewed: 02/04/2015 Elsevier Interactive Patient Education  2017 [place] Prevention in the Home Falls can cause injuries. They can happen to people of all ages. There are many things you can do to make your home safe and to help prevent falls. What can I do on the outside of my home?  Regularly fix the edges of walkways and driveways and fix any cracks.  Remove anything that might make you trip as you walk through a door, such as a raised step or threshold.  Trim any bushes or trees on the path to your home.  Use bright outdoor lighting.  Clear any walking paths of anything that might make someone trip, such as rocks or tools.  Regularly check to see if handrails are loose or broken. Make sure that both sides of any steps have handrails.  Any raised decks and porches should have guardrails on the edges.  Have any leaves, snow, or ice cleared regularly.  Use sand or salt on walking paths during winter.  Clean up any spills in your garage right away. This includes oil or grease spills. What can I do in the bathroom?  Use night lights.  Install grab bars by the toilet and in the tub and shower. Do not use towel bars as grab bars.  Use non-skid mats or decals in the tub or shower.  If  you need to sit down in the shower, use a plastic, non-slip stool.  Keep the floor dry. Clean up any water that spills on the floor as soon as it happens.  Remove soap buildup in the tub or shower regularly.  Attach bath mats securely with double-sided non-slip rug tape.  Do not have throw rugs and other things on the floor that can make you trip. What can I do in the bedroom?  Use night lights.  Make sure that you have a light by your bed that is easy to reach.  Do not use any sheets or blankets that are too big for your bed. They should not hang down onto the floor.  Have a firm chair that has side arms. You can use this for support while you get dressed.  Do not have throw rugs and other things on the floor that can make you trip. What can I do in the kitchen?  Clean up any spills right away.  Avoid walking on wet floors.  Keep items that you use a lot in easy-to-reach places.  If you need to reach something above you, use a strong step stool that has a  grab bar.  Keep electrical cords out of the way.  Do not use floor polish or wax that makes floors slippery. If you must use wax, use non-skid floor wax.  Do not have throw rugs and other things on the floor that can make you trip. What can I do with my stairs?  Do not leave any items on the stairs.  Make sure that there are handrails on both sides of the stairs and use them. Fix handrails that are broken or loose. Make sure that handrails are as long as the stairways.  Check any carpeting to make sure that it is firmly attached to the stairs. Fix any carpet that is loose or worn.  Avoid having throw rugs at the top or bottom of the stairs. If you do have throw rugs, attach them to the floor with carpet tape.  Make sure that you have a light switch at the top of the stairs and the bottom of the stairs. If you do not have them, ask someone to add them for you. What else can I do to help prevent falls?  Wear shoes  that:  Do not have high heels.  Have rubber bottoms.  Are comfortable and fit you well.  Are closed at the toe. Do not wear sandals.  If you use a stepladder:  Make sure that it is fully opened. Do not climb a closed stepladder.  Make sure that both sides of the stepladder are locked into place.  Ask someone to hold it for you, if possible.  Clearly mark and make sure that you can see:  Any grab bars or handrails.  First and last steps.  Where the edge of each step is.  Use tools that help you move around (mobility aids) if they are needed. These include:  Canes.  Walkers.  Scooters.  Crutches.  Turn on the lights when you go into a dark area. Replace any light bulbs as soon as they burn out.  Set up your furniture so you have a clear path. Avoid moving your furniture around.  If any of your floors are uneven, fix them.  If there are any pets around you, be aware of where they are.  Review your medicines with your doctor. Some medicines can make you feel dizzy. This can increase your chance of falling. Ask your doctor what other things that you can do to help prevent falls. This information is not intended to replace advice given to you by your health care provider. Make sure you discuss any questions you have with your health care provider. Document Released: 01/30/2009 Document Revised: 09/11/2015 Document Reviewed: 05/10/2014 Elsevier Interactive Patient Education  2017 [employer].

## 2019-02-02 ENCOUNTER — Other Ambulatory Visit: Payer: Medicare Other

## 2019-02-02 ENCOUNTER — Other Ambulatory Visit: Payer: Self-pay

## 2019-02-02 DIAGNOSIS — I10 Essential (primary) hypertension: Secondary | ICD-10-CM | POA: Diagnosis not present

## 2019-02-02 DIAGNOSIS — E039 Hypothyroidism, unspecified: Secondary | ICD-10-CM | POA: Diagnosis not present

## 2019-02-02 DIAGNOSIS — R7309 Other abnormal glucose: Secondary | ICD-10-CM

## 2019-02-02 DIAGNOSIS — Z0000 Encounter for general adult medical examination without abnormal findings: Secondary | ICD-10-CM

## 2019-02-02 DIAGNOSIS — E669 Obesity, unspecified: Secondary | ICD-10-CM

## 2019-02-03 LAB — CBC WITH DIFFERENTIAL/PLATELET
Absolute Monocytes: 519 cells/uL (ref 200–950)
Basophils Absolute: 71 cells/uL (ref 0–200)
Basophils Relative: 1.2 %
Eosinophils Absolute: 201 cells/uL (ref 15–500)
Eosinophils Relative: 3.4 %
HCT: 39.6 % (ref 35.0–45.0)
Hemoglobin: 12.4 g/dL (ref 11.7–15.5)
Lymphs Abs: 2437 cells/uL (ref 850–3900)
MCH: 21.8 pg — ABNORMAL LOW (ref 27.0–33.0)
MCHC: 31.3 g/dL — ABNORMAL LOW (ref 32.0–36.0)
MCV: 69.7 fL — ABNORMAL LOW (ref 80.0–100.0)
MPV: 11.8 fL (ref 7.5–12.5)
Monocytes Relative: 8.8 %
Neutro Abs: 2673 cells/uL (ref 1500–7800)
Neutrophils Relative %: 45.3 %
Platelets: 213 10*3/uL (ref 140–400)
RBC: 5.68 10*6/uL — ABNORMAL HIGH (ref 3.80–5.10)
RDW: 14.8 % (ref 11.0–15.0)
Total Lymphocyte: 41.3 %
WBC: 5.9 10*3/uL (ref 3.8–10.8)

## 2019-02-03 LAB — LIPID PANEL
Cholesterol: 135 mg/dL (ref <200)
HDL: 37 mg/dL — ABNORMAL LOW (ref >=50)
LDL Cholesterol (Calc): 81 mg/dL (calc)
Non-HDL Cholesterol (Calc): 98 mg/dL (calc) (ref <130)
Total CHOL/HDL Ratio: 3.6 (calc) (ref <5.0)
Triglycerides: 89 mg/dL (ref <150)

## 2019-02-03 LAB — HEMOGLOBIN A1C
Hgb A1c MFr Bld: 6 % of total Hgb — ABNORMAL HIGH (ref <5.7)
Mean Plasma Glucose: 126 (calc)
eAG (mmol/L): 7 (calc)

## 2019-02-03 LAB — COMPLETE METABOLIC PANEL WITH GFR
AG Ratio: 1.5 (calc) (ref 1.0–2.5)
ALT: 11 U/L (ref 6–29)
AST: 14 U/L (ref 10–35)
Albumin: 4 g/dL (ref 3.6–5.1)
Alkaline phosphatase (APISO): 81 U/L (ref 37–153)
BUN: 13 mg/dL (ref 7–25)
CO2: 29 mmol/L (ref 20–32)
Calcium: 9.3 mg/dL (ref 8.6–10.4)
Chloride: 101 mmol/L (ref 98–110)
Creat: 0.67 mg/dL (ref 0.60–0.93)
GFR, Est African American: 103 mL/min/{1.73_m2} (ref >=60)
GFR, Est Non African American: 89 mL/min/{1.73_m2} (ref >=60)
Globulin: 2.7 g/dL (calc) (ref 1.9–3.7)
Glucose, Bld: 102 mg/dL — ABNORMAL HIGH (ref 65–99)
Potassium: 3.5 mmol/L (ref 3.5–5.3)
Sodium: 139 mmol/L (ref 135–146)
Total Bilirubin: 0.8 mg/dL (ref 0.2–1.2)
Total Protein: 6.7 g/dL (ref 6.1–8.1)

## 2019-02-03 LAB — TSH: TSH: 3 mIU/L (ref 0.40–4.50)

## 2019-02-03 LAB — CBC MORPHOLOGY

## 2019-02-03 LAB — T4, FREE: Free T4: 1.2 ng/dL (ref 0.8–1.8)

## 2019-02-09 ENCOUNTER — Other Ambulatory Visit: Payer: Self-pay

## 2019-02-09 ENCOUNTER — Encounter: Payer: Self-pay | Admitting: Family Medicine

## 2019-02-09 ENCOUNTER — Ambulatory Visit (INDEPENDENT_AMBULATORY_CARE_PROVIDER_SITE_OTHER): Payer: Medicare Other | Admitting: Family Medicine

## 2019-02-09 VITALS — BP 120/64 | HR 56 | Temp 98.4°F | Resp 16 | Ht 68.0 in | Wt 185.0 lb

## 2019-02-09 DIAGNOSIS — Z0000 Encounter for general adult medical examination without abnormal findings: Secondary | ICD-10-CM | POA: Diagnosis not present

## 2019-02-09 DIAGNOSIS — I10 Essential (primary) hypertension: Secondary | ICD-10-CM

## 2019-02-09 DIAGNOSIS — Z23 Encounter for immunization: Secondary | ICD-10-CM

## 2019-02-09 DIAGNOSIS — R7309 Other abnormal glucose: Secondary | ICD-10-CM

## 2019-02-09 DIAGNOSIS — R718 Other abnormality of red blood cells: Secondary | ICD-10-CM

## 2019-02-09 DIAGNOSIS — E039 Hypothyroidism, unspecified: Secondary | ICD-10-CM | POA: Diagnosis not present

## 2019-02-09 DIAGNOSIS — E785 Hyperlipidemia, unspecified: Secondary | ICD-10-CM

## 2019-02-09 DIAGNOSIS — E663 Overweight: Secondary | ICD-10-CM

## 2019-02-09 NOTE — Assessment & Plan Note (Signed)
Controlled HTN overall - Home BP readings - stable No known complications  Plan:  1. Continue current Amlodipine 5mg daily, HCTZ 25mg daily, Metoprolol XL 25mg daily 2. Encourage improved lifestyle - low sodium diet, regular exercise 3. Continue monitor BP outside office, bring readings to next visit, if persistently >140/90 or new symptoms notify office sooner 4. Follow-up 6 months 

## 2019-02-09 NOTE — Progress Notes (Signed)
Subjective:    Patient ID: Sharon Craig, female    DOB: 1951/02/17, 68 y.o.   MRN: [id number]  Sharon Craig is a 68 y.o. female presenting on 02/09/2019 for Annual Exam   HPI   Here for Annual Physical and Lab Review.  CHRONIC HTN: BP Controlled, improved since prior reduced dose Amlodipine Current Meds -Amlodipine5mg  daily, HCTZ  daily, Metoprolol XL  daily Reports good compliance, took meds today. Tolerating well, w/o complaints. Admits some ankle edema, at times - improved now  Pre-Diabetes/ Overweight BMI >28 A1c stable at 6.0 from previous - Not checking CBG. Never on medicine Lifestyle - Weight loss down 10 lbs in 1 year - Diet (still with sweet tooth but overall much improved diet, reduce portion, limit bread, eating vegetables) - Exercise (still some active with walking outdoors, walking around stores and stays active most days) Denies hypoglycemia  HYPERLIPIDEMIA: - Reports no concerns. Last lipid panel 01/2019, mostly controlled slight low HDL - Currently taking Atorvastatin  nightly, tolerating well without side effects or myalgias Lifestyle - Diet: see above  Low MCV Background history of some anemia in past, but has been normal more recently. History of chronic low MCV and RBC indices, recent lab similar to previous. Had peripheral smear morphology of RBC as well.  Hypothyroidism Chronic problem, has been stable on past TSH lab checks. Last result was normal. Continues on Levothyroxine daily  Additional concern  Sinus drainage / allergies - describes post nasal drainage and clears this phlegm up occasionally without problem, no concern for infection. Using flonase intermittent only and zyrtec intermittent  Health Maintenance: Due for Flu Shot today Mammogram scheduled for 03/30/19 UTD Colonoscopy UTD Pneumonia vaccine   Depression screen [facility] 2/9 02/09/2019 01/23/2019 01/17/2018  Decreased Interest 0 0 0   Down, Depressed, Hopeless 0 0 0  PHQ - 2 Score 0 0 0    Past Medical History:  Diagnosis Date  . Hyperlipidemia   . Hypothyroidism    Past Surgical History:  Procedure Laterality Date  . ABDOMINAL HYSTERECTOMY    . COLONOSCOPY WITH PROPOFOL N/A 12/05/2014   Procedure: COLONOSCOPY WITH PROPOFOL;  Surgeon: Wallace Cullens, MD;  Location: [facility] ENDOSCOPY;  Service: Gastroenterology;  Laterality: N/A;  . KNEE SURGERY Left   . TUBAL LIGATION     Social History   Socioeconomic History  . Marital status: Married    Spouse name: Not on file  . Number of children: Not on file  . Years of education: Not on file  . Highest education level: 12th grade  Occupational History  . Not on file  Social Needs  . Financial resource strain: Not hard at all  . Food insecurity    Worry: Never true    Inability: Never true  . Transportation needs    Medical: No    Non-medical: No  Tobacco Use  . Smoking status: Never Smoker  . Smokeless tobacco: Never Used  Substance and Sexual Activity  . Alcohol use: No  . Drug use: No  . Sexual activity: Not on file  Lifestyle  . Physical activity    Days per week: 0 days    Minutes per session: 0 min  . Stress: Not at all  Relationships  . Social connections    Talks on phone: More than three times a week    Gets together: More than three times a week    Attends religious service: More than 4 times per year  Active member of club or organization: No    Attends meetings of clubs or organizations: Never    Relationship status: Married  . Intimate partner violence    Fear of current or ex partner: No    Emotionally abused: No    Physically abused: No    Forced sexual activity: No  Other Topics Concern  . Not on file  Social History Narrative  . Not on file   Family History  Problem Relation Age of Onset  . Heart failure Mother   . Hypertension Mother   . Gout Mother   . Hypothyroidism Mother   . Heart Problems Sister   . Heart Problems  Brother   . Breast cancer Neg Hx    Current Outpatient Medications on File Prior to Visit  Medication Sig  . amLODipine (NORVASC) 5 MG tablet TAKE 1 TABLET(5 MG) BY MOUTH DAILY  . atorvastatin (LIPITOR) 20 MG tablet TAKE 1 TABLET(20 MG) BY MOUTH AT BEDTIME  . cetirizine (ZYRTEC) 10 MG tablet Take 10 mg by mouth daily.  . ferrous sulfate 325 (65 FE) MG tablet Take 325 mg by mouth daily with breakfast.  . fluticasone (FLONASE) 50 MCG/ACT nasal spray SHAKE LIQUID AND USE 2 SPRAYS IN EACH NOSTRIL DAILY  . hydrochlorothiazide (HYDRODIURIL) 25 MG tablet TAKE 1 TABLET BY MOUTH DAILY  . levothyroxine (SYNTHROID) 25 MCG tablet TAKE 1 TABLET(25 MCG) BY MOUTH DAILY  . metoprolol succinate (TOPROL-XL) 25 MG 24 hr tablet TAKE 1 TABLET(25 MG) BY MOUTH DAILY   No current facility-administered medications on file prior to visit.     Review of Systems  Constitutional: Negative for activity change, appetite change, chills, diaphoresis, fatigue and fever.  HENT: Negative for congestion and hearing loss.        Phlegm sinus drainage  Eyes: Negative for visual disturbance.  Respiratory: Negative for cough, chest tightness, shortness of breath and wheezing.   Cardiovascular: Negative for chest pain, palpitations and leg swelling.  Gastrointestinal: Negative for abdominal pain, constipation, diarrhea, nausea and vomiting.  Endocrine: Negative for cold intolerance.  Genitourinary: Negative for dysuria, frequency and hematuria.  Musculoskeletal: Negative for arthralgias and neck pain.  Skin: Negative for rash.  Allergic/Immunologic: Negative for environmental allergies.  Neurological: Negative for dizziness, weakness, light-headedness, numbness and headaches.  Hematological: Negative for adenopathy.  Psychiatric/Behavioral: Negative for behavioral problems, dysphoric mood and sleep disturbance.   Per HPI unless specifically indicated above     Objective:    BP 120/64   Pulse (!) 56   Temp 98.4 F  (36.9 C) (Oral)   Resp 16   Ht 5\' 8"  (1.727 m)   Wt 185 lb (83.9 kg)   BMI 28.13 kg/m   Wt Readings from Last 3 Encounters:  02/09/19 185 lb (83.9 kg)  01/26/18 195 lb 9.6 oz (88.7 kg)  01/17/18 194 lb 9.6 oz (88.3 kg)    Physical Exam Vitals signs and nursing note reviewed.  Constitutional:      General: She is not in acute distress.    Appearance: She is well-developed. She is not diaphoretic.     Comments: Well-appearing, comfortable, cooperative  HENT:     Head: Normocephalic and atraumatic.  Eyes:     General:        Right eye: No discharge.        Left eye: No discharge.     Conjunctiva/sclera: Conjunctivae normal.     Pupils: Pupils are equal, round, and reactive to light.  Neck:  Musculoskeletal: Normal range of motion and neck supple.     Thyroid: No thyromegaly.     Comments: No carotid bruits  Mild thyroid gland fullness symmetrical no nodules or tender Cardiovascular:     Rate and Rhythm: Normal rate and regular rhythm.     Heart sounds: Normal heart sounds. No murmur.  Pulmonary:     Effort: Pulmonary effort is normal. No respiratory distress.     Breath sounds: Normal breath sounds. No wheezing or rales.  Abdominal:     General: Bowel sounds are normal. There is no distension.     Palpations: Abdomen is soft. There is no mass.     Tenderness: There is no abdominal tenderness.  Musculoskeletal: Normal range of motion.        General: No tenderness.     Comments: Upper / Lower Extremities: - Normal muscle tone, strength bilateral upper extremities 5/5, lower extremities 5/5  Lymphadenopathy:     Cervical: No cervical adenopathy.  Skin:    General: Skin is warm and dry.     Findings: No erythema or rash.  Neurological:     Mental Status: She is alert and oriented to person, place, and time.     Comments: Distal sensation intact to light touch all extremities  Psychiatric:        Behavior: Behavior normal.     Comments: Well groomed, good eye  contact, normal speech and thoughts       Results for orders placed or performed in visit on 02/02/19  COMPLETE METABOLIC PANEL WITH GFR  Result Value Ref Range   Glucose, Bld 102 (H) 65 - 99 mg/dL   BUN 13 7 - 25 mg/dL   Creat 6.44 0.34 - 7.42 mg/dL   GFR, Est Non African American 89 > OR = 60 mL/min/1.66m2   GFR, Est African American 103 > OR = 60 mL/min/1.27m2   BUN/Creatinine Ratio NOT APPLICABLE 6 - 22 (calc)   Sodium 139 135 - 146 mmol/L   Potassium 3.5 3.5 - 5.3 mmol/L   Chloride 101 98 - 110 mmol/L   CO2 29 20 - 32 mmol/L   Calcium 9.3 8.6 - 10.4 mg/dL   Total Protein 6.7 6.1 - 8.1 g/dL   Albumin 4.0 3.6 - 5.1 g/dL   Globulin 2.7 1.9 - 3.7 g/dL (calc)   AG Ratio 1.5 1.0 - 2.5 (calc)   Total Bilirubin 0.8 0.2 - 1.2 mg/dL   Alkaline phosphatase (APISO) 81 37 - 153 U/L   AST 14 10 - 35 U/L   ALT 11 6 - 29 U/L  T4, free  Result Value Ref Range   Free T4 1.2 0.8 - 1.8 ng/dL  TSH  Result Value Ref Range   TSH 3.00 0.40 - 4.50 mIU/L  Hemoglobin A1c  Result Value Ref Range   Hgb A1c MFr Bld 6.0 (H) <5.7 % of total Hgb   Mean Plasma Glucose 126 (calc)   eAG (mmol/L) 7.0 (calc)  Lipid Profile  Result Value Ref Range   Cholesterol 135 <200 mg/dL   HDL 37 (L) > OR = 50 mg/dL   Triglycerides 89 <595 mg/dL   LDL Cholesterol (Calc) 81 mg/dL (calc)   Total CHOL/HDL Ratio 3.6 <5.0 (calc)   Non-HDL Cholesterol (Calc) 98 <638 mg/dL (calc)  CBC with Differential  Result Value Ref Range   WBC 5.9 3.8 - 10.8 Thousand/uL   RBC 5.68 (H) 3.80 - 5.10 Million/uL   Hemoglobin 12.4 11.7 - 15.5 g/dL  HCT 39.6 35.0 - 45.0 %   MCV 69.7 (L) 80.0 - 100.0 fL   MCH 21.8 (L) 27.0 - 33.0 pg   MCHC 31.3 (L) 32.0 - 36.0 g/dL   RDW 16.114.8 09.611.0 - 04.515.0 %   Platelets 213 140 - 400 Thousand/uL   MPV 11.8 7.5 - 12.5 fL   Neutro Abs 2,673 1,500 - 7,800 cells/uL   Lymphs Abs 2,437 850 - 3,900 cells/uL   Absolute Monocytes 519 200 - 950 cells/uL   Eosinophils Absolute 201 15 - 500 cells/uL    Basophils Absolute 71 0 - 200 cells/uL   Neutrophils Relative % 45.3 %   Total Lymphocyte 41.3 %   Monocytes Relative 8.8 %   Eosinophils Relative 3.4 %   Basophils Relative 1.2 %  CBC MORPHOLOGY  Result Value Ref Range   CBC MORPHOLOGY  NORMAL      Assessment & Plan:   Problem List Items Addressed This Visit    Overweight (BMI 25.0-29.9)    Weight improved with 10 lb in 1 year wt loss, continued Encourage continue healthy lifestyle, diet, exercise      Low mean corpuscular volume (MCV)    Clinically chronic issue identified on CBC also had peripheral smear morphology Seems to have stable Hemoglobin without anemia or other concerns or symptoms Reassurance Follow-up yearly CBC, future if new concern can consider Hematology, seems more likely genetic factor      Hypothyroidism    Stable chronic problem Last TSH normal 01/2019 Continue current dose Levothyroxine 25mcg daily Check yearly      Elevated hemoglobin A1c    Stable A1c to 6.0 - despite improved lifestyle  Plan:  1. Not on any therapy currently  2. Encourage improved lifestyle - has goal to change poor diet habits now - low carb, low sugar diet, reduce portion size, continue improving regular exercise 3. Follow-up 6 months PreDM A1c      Dyslipidemia    Controlled cholesterol on statin and lifestyle Last lipid panel mild low HDL, stable  Plan: 1. Continue current meds - Atorvastatin 20mg  nightly 2. Encourage improved lifestyle - low carb/cholesterol, reduce portion size, continue improving regular exercise Follow-up yearly      Benign essential HTN    Controlled HTN overall - Home BP readings - stable No known complications  Plan:  1. Continue current Amlodipine 5mg  daily, HCTZ 25mg  daily, Metoprolol XL 25mg  daily 2. Encourage improved lifestyle - low sodium diet, regular exercise 3. Continue monitor BP outside office, bring readings to next visit, if persistently >140/90 or new symptoms notify office  sooner 4. Follow-up 6 months       Other Visit Diagnoses    Annual physical exam    -  Primary   Needs flu shot       Relevant Orders   Flu Vaccine QUAD High Dose(Fluad) (Completed)     Updated Health Maintenance information - Flu shot today Reviewed recent lab results with patient Encouraged improvement to lifestyle with diet and exercise Maintain healthy weight   No orders of the defined types were placed in this encounter.   Follow up plan: Return in about 6 months (around 08/10/2019) for 6 month Pre-DM A1c.  Saralyn PilarAlexander Bhavik Cabiness, DO [facility] 02/09/2019, 8:52 AM

## 2019-02-09 NOTE — Assessment & Plan Note (Signed)
Clinically chronic issue identified on CBC also had peripheral smear morphology Seems to have stable Hemoglobin without anemia or other concerns or symptoms Reassurance Follow-up yearly CBC, future if new concern can consider Hematology, seems more likely genetic factor

## 2019-02-09 NOTE — Patient Instructions (Addendum)
Thank you for coming to the office today.  High dose Flu Shot today  Shingrix - shingles vaccine - at pharmacy, 2 doses, check with them for cost and coverage  Keep up great work  Grapefruit juice not with pills, occasionally is fine.  Keep hydrated.  Monitor BP  Mammogram 03/30/19  Please schedule a Follow-up Appointment to: Return in about 6 months (around 08/10/2019) for 6 month Pre-DM A1c.  If you have any other questions or concerns, please feel free to call the office or send a message through [place]. You may also schedule an earlier appointment if necessary.  Additionally, you may be receiving a survey about your experience at our office within a few days to 1 week by e-mail or mail. We value your feedback.  Sharon Putnam, DO [facility]

## 2019-02-09 NOTE — Assessment & Plan Note (Signed)
Stable chronic problem Last TSH normal 01/2019 Continue current dose Levothyroxine 52mcg daily Check yearly

## 2019-02-09 NOTE — Assessment & Plan Note (Signed)
Weight improved with 10 lb in 1 year wt loss, continued Encourage continue healthy lifestyle, diet, exercise

## 2019-02-09 NOTE — Assessment & Plan Note (Signed)
Stable A1c to 6.0 - despite improved lifestyle  Plan:  1. Not on any therapy currently  2. Encourage improved lifestyle - has goal to change poor diet habits now - low carb, low sugar diet, reduce portion size, continue improving regular exercise 3. Follow-up 6 months PreDM A1c

## 2019-02-09 NOTE — Assessment & Plan Note (Signed)
Controlled cholesterol on statin and lifestyle Last lipid panel mild low HDL, stable  Plan: 1. Continue current meds - Atorvastatin 20mg  nightly 2. Encourage improved lifestyle - low carb/cholesterol, reduce portion size, continue improving regular exercise Follow-up yearly

## 2019-03-29 ENCOUNTER — Other Ambulatory Visit: Payer: Self-pay | Admitting: Family Medicine

## 2019-03-29 DIAGNOSIS — E039 Hypothyroidism, unspecified: Secondary | ICD-10-CM

## 2019-03-30 ENCOUNTER — Ambulatory Visit
Admission: RE | Admit: 2019-03-30 | Discharge: 2019-03-30 | Disposition: A | Discharge status: Home / Self Care | Payer: Medicare Other | Source: Ambulatory Visit | Attending: Family Medicine | Admitting: Family Medicine

## 2019-03-30 DIAGNOSIS — Z0000 Encounter for general adult medical examination without abnormal findings: Secondary | ICD-10-CM | POA: Diagnosis not present

## 2019-03-30 DIAGNOSIS — Z1231 Encounter for screening mammogram for malignant neoplasm of breast: Secondary | ICD-10-CM | POA: Insufficient documentation

## 2019-04-06 ENCOUNTER — Telehealth: Payer: Self-pay | Admitting: Family Medicine

## 2019-04-06 NOTE — Telephone Encounter (Signed)
    Called pt regarding  Community Resource Referral - Home Repairs/Window Replacement/Tree Maintenance. Pt stated that the railing and steps leading into her front entrance are wobbly and not stable. Gave resource for [employer] program to call and get on their waiting list, she will need to do the phone screening first. (owns their own home) She also stated that her windows are old and are starting to fall, she told me that her son would be helping to put the clear plastic over her windows this winter to help with warm air escaping. She will also be calling [employer] to find out if her home would be eligible for their help based on income. The branches and trees near their home are very close to her roof and she is concerned about the next ice/wind storm and damage to their home. She will be calling [employer] first to find out if they can come remove some limbs that are closest to the lines and home and I have emailed the [employer] for [employer] to see if there are any tree services that are available for seniors.   [place] . Embedded Care Coordination   Care Management ??[email or web address]  ??[phone]

## 2019-04-06 NOTE — Telephone Encounter (Signed)
Email to [employer] for [place] [facility]  From: Jill [facility])  Sent: Friday, April 06, 2019 9:46 AM To: Robie [email or web address]> Subject: Resource for Medicare Pt in [place], Do you happen to know of a resource in [place] to help with tree removal (or limbing up of trees) that are very close to a home? I have a pt at [facility] who is 68 y/o and is concerned about the next wind or ice storm and the trees/limbs damaging her home.  I advised her to first call [employer] as there is a power line close to this area but curious to know if you happen to have heard of any other agencies or organizations that could assist.  Thanks!   [place] . Embedded Care Coordination [place]  Care Management ??[email or web address]  ??[phone]

## 2019-04-21 ENCOUNTER — Other Ambulatory Visit: Payer: Self-pay | Admitting: Family Medicine

## 2019-04-21 DIAGNOSIS — I10 Essential (primary) hypertension: Secondary | ICD-10-CM

## 2019-04-22 ENCOUNTER — Other Ambulatory Visit: Payer: Self-pay | Admitting: Family Medicine

## 2019-04-22 DIAGNOSIS — I10 Essential (primary) hypertension: Secondary | ICD-10-CM

## 2019-04-23 ENCOUNTER — Other Ambulatory Visit: Payer: Self-pay | Admitting: Family Medicine

## 2019-04-23 DIAGNOSIS — I10 Essential (primary) hypertension: Secondary | ICD-10-CM

## 2019-07-04 ENCOUNTER — Other Ambulatory Visit: Payer: Self-pay | Admitting: Family Medicine

## 2019-07-04 DIAGNOSIS — I10 Essential (primary) hypertension: Secondary | ICD-10-CM

## 2019-07-11 ENCOUNTER — Other Ambulatory Visit: Payer: Self-pay

## 2019-07-11 NOTE — Patient Outreach (Signed)
[facility]) Care Management  07/11/2019  Sharon Craig 09/24/1950 [id number]   Medication Adherence call to Sharon Craig [facility] Ident[facility] Verify spoke with patient she is past due on Atorvastatin 20 mg,patient explain she is taking 1 tablet daily,patient has a pill box,patient fills on a weekly basis,patient has medication for other 20 days,patient does not want to order until she is finished with what she have. [name] is showing past due under [employer].   Lillia Abed CPhT Pharmacy Technician [facility] Care Management Direct Dial [phone]  Fax [phone] [email or web address]

## 2019-08-03 DIAGNOSIS — H35341 Macular cyst, hole, or pseudohole, right eye: Secondary | ICD-10-CM | POA: Diagnosis not present

## 2019-08-03 DIAGNOSIS — H2513 Age-related nuclear cataract, bilateral: Secondary | ICD-10-CM | POA: Diagnosis not present

## 2019-08-10 ENCOUNTER — Ambulatory Visit (INDEPENDENT_AMBULATORY_CARE_PROVIDER_SITE_OTHER): Payer: Medicare Other | Admitting: Family Medicine

## 2019-08-10 ENCOUNTER — Other Ambulatory Visit: Payer: Self-pay | Admitting: Family Medicine

## 2019-08-10 ENCOUNTER — Other Ambulatory Visit: Payer: Self-pay

## 2019-08-10 ENCOUNTER — Encounter: Payer: Self-pay | Admitting: Family Medicine

## 2019-08-10 VITALS — BP 116/67 | HR 63 | Temp 97.3°F | Resp 16 | Ht 68.0 in | Wt 190.6 lb

## 2019-08-10 DIAGNOSIS — E663 Overweight: Secondary | ICD-10-CM

## 2019-08-10 DIAGNOSIS — R7309 Other abnormal glucose: Secondary | ICD-10-CM

## 2019-08-10 DIAGNOSIS — I10 Essential (primary) hypertension: Secondary | ICD-10-CM | POA: Diagnosis not present

## 2019-08-10 DIAGNOSIS — Z0000 Encounter for general adult medical examination without abnormal findings: Secondary | ICD-10-CM

## 2019-08-10 DIAGNOSIS — E785 Hyperlipidemia, unspecified: Secondary | ICD-10-CM

## 2019-08-10 DIAGNOSIS — R718 Other abnormality of red blood cells: Secondary | ICD-10-CM

## 2019-08-10 DIAGNOSIS — E039 Hypothyroidism, unspecified: Secondary | ICD-10-CM

## 2019-08-10 LAB — POCT GLYCOSYLATED HEMOGLOBIN (HGB A1C): Hemoglobin A1C: 5.8 % — AB (ref 4.0–5.6)

## 2019-08-10 NOTE — Assessment & Plan Note (Signed)
A1c improved to 5.8, from 6  Plan:  1. Not on any therapy currently  2. Encourage improved lifestyle - has goal to change poor diet habits now - low carb, low sugar diet, reduce portion size, continue improving regular exercise  6  Mo A1c w labs

## 2019-08-10 NOTE — Patient Instructions (Addendum)
Thank you for coming to the office today.  Recent Labs    02/02/19 0856 08/10/19 1008  HGBA1C 6.0* 5.8*   [place] COVID19 Vaccine Information  LOCATION:  [facility]) [address] [employer] [place] [address]  Hours: Monday - Sunday 8:00am to 12:00pm  COVID-19 Vaccines By Appointment Only  Sign up for [facility] Vaccine - Wait List  [email or web address] or text "VACCINE" to [phone] or call [phone]   DUE for FASTING BLOOD WORK (no food or drink after midnight before the lab appointment, only water or coffee without cream/sugar on the morning of)  SCHEDULE "Lab Only" visit in the morning at the clinic for lab draw in 6 MONTHS   - Make sure Lab Only appointment is at about 1 week before your next appointment, so that results will be available  For Lab Results, once available within 2-3 days of blood draw, you can can log in to MyChart online to view your results and a brief explanation. Also, we can discuss results at next follow-up visit.   Please schedule a Follow-up Appointment to: Return in about 6 months (around 02/09/2020) for Annual Physical.  If you have any other questions or concerns, please feel free to call the office or send a message through MyChart. You may also schedule an earlier appointment if necessary.  Additionally, you may be receiving a survey about your experience at our office within a few days to 1 week by e-mail or mail. We value your feedback.  Saralyn Pilar, DO [facility] [place]

## 2019-08-10 NOTE — Assessment & Plan Note (Signed)
Controlled HTN overall - Home BP readings - stable No known complications  Plan:  1. Continue current Amlodipine 5mg  daily, HCTZ 25mg  daily, Metoprolol XL 25mg  daily 2. Encourage improved lifestyle - low sodium diet, regular exercise 3. Continue monitor BP outside office, bring readings to next visit, if persistently >140/90 or new symptoms notify office sooner 4. Follow-up 6 months

## 2019-08-10 NOTE — Progress Notes (Signed)
Subjective:    Patient ID: Sharon Craig, female    DOB: 1950-08-03, 69 y.o.   MRN: [id number]  Sharon Craig is a 69 y.o. female presenting on 08/10/2019 for Pre-Diabetes   HPI   CHRONIC HTN: BP Controlled, improved since prior reduced dose Amlodipine Current Meds -Amlodipine5mg  daily, HCTZ 25mg  daily, Metoprolol XL 25mg  daily Reports good compliance, took meds today. Tolerating well, w/o complaints. Admits some ankle edema,at times - improved now  Pre-Diabetes/Overweight BMI >28 A1c improved to 5.8 - Not checking CBG. Never on medicine Lifestyle - Diet(still with sweets intake but overall much improved diet, reduce portion, limit bread, eating vegetables) - Exercise (still someactive with walking outdoors, walking around stores and stays active most days) Denies hypoglycemia    Depression screen [facility] 2/9 08/10/2019 02/09/2019 01/23/2019  Decreased Interest 0 0 0  Down, Depressed, Hopeless 0 0 0  PHQ - 2 Score 0 0 0    Social History   Tobacco Use  . Smoking status: Never Smoker  . Smokeless tobacco: Never Used  Substance Use Topics  . Alcohol use: No  . Drug use: No    Review of Systems Per HPI unless specifically indicated above     Objective:    BP 116/67   Pulse 63   Temp (!) 97.3 F (36.3 C) (Temporal)   Resp 16   Ht 5\' 8"  (1.727 m)   Wt 190 lb 9.6 oz (86.5 kg)   BMI 28.98 kg/m   Wt Readings from Last 3 Encounters:  08/10/19 190 lb 9.6 oz (86.5 kg)  02/09/19 185 lb (83.9 kg)  01/26/18 195 lb 9.6 oz (88.7 kg)    Physical Exam Vitals and nursing note reviewed.  Constitutional:      General: She is not in acute distress.    Appearance: She is well-developed. She is not diaphoretic.     Comments: Well-appearing, comfortable, cooperative  HENT:     Head: Normocephalic and atraumatic.  Eyes:     General:        Right eye: No discharge.        Left eye: No discharge.     Conjunctiva/sclera: Conjunctivae normal.    Cardiovascular:     Rate and Rhythm: Normal rate.  Pulmonary:     Effort: Pulmonary effort is normal.  Skin:    General: Skin is warm and dry.     Findings: No erythema or rash.  Neurological:     Mental Status: She is alert and oriented to person, place, and time.  Psychiatric:        Behavior: Behavior normal.     Comments: Well groomed, good eye contact, normal speech and thoughts       Recent Labs    02/02/19 0856 08/10/19 1008  HGBA1C 6.0* 5.8*    Results for orders placed or performed in visit on 08/10/19  POCT HgB A1C  Result Value Ref Range   Hemoglobin A1C 5.8 (A) 4.0 - 5.6 %      Assessment & Plan:   Problem List Items Addressed This Visit    Overweight (BMI 25.0-29.9)    Weight up 5 lbs from 5 months, but overall down 5 from previous year. Encourage lifestyle improvement diet/exercise      Elevated hemoglobin A1c - Primary    A1c improved to 5.8, from 6  Plan:  1. Not on any therapy currently  2. Encourage improved lifestyle - has goal to change poor diet habits now - low  carb, low sugar diet, reduce portion size, continue improving regular exercise  6  Mo A1c w labs      Relevant Orders   POCT HgB A1C (Completed)   Benign essential HTN    Controlled HTN overall - Home BP readings - stable No known complications  Plan:  1. Continue current Amlodipine 5mg  daily, HCTZ 25mg  daily, Metoprolol XL 25mg  daily 2. Encourage improved lifestyle - low sodium diet, regular exercise 3. Continue monitor BP outside office, bring readings to next visit, if persistently >140/90 or new symptoms notify office sooner 4. Follow-up 6 months         No orders of the defined types were placed in this encounter.     Follow up plan: Return in about 6 months (around 02/09/2020) for Annual Physical.  Future labs ordered for 01/2020  Nobie Putnam, [facility] 08/10/2019, 10:07 AM

## 2019-08-10 NOTE — Assessment & Plan Note (Signed)
Weight up 5 lbs from 5 months, but overall down 5 from previous year. Encourage lifestyle improvement diet/exercise

## 2019-08-14 ENCOUNTER — Telehealth: Payer: Self-pay

## 2019-08-14 NOTE — Telephone Encounter (Signed)
Copied from CRM [id number]. Topic: Quick Communication - See Telephone Encounter >> Aug 14, 2019  9:47 AM Aretta Nip wrote: CRM for notification. See Telephone encounter for: 08/14/19.ferrous sulfate 325 (65 FE) MG tablet [[id number]]  Pt found this on her list of meds from her visit yesterday and is not taking it and wanted to know what it was for. Pt wants to talk to a nurse to find out exactly what replaced it. Told pt would FU upon nurse availability,       Authorizing Provider: [provider] NPI: [id number] Ordering User:  Letta Median, CMA   Spoke to the patient she did not know what was Ferrous sulfate was for and explain the patient it is Iron supplement that she has taken in past which is over the counter as well and it falls under historical provider. She is okay with it and did not want me to delete it.

## 2019-08-14 NOTE — Telephone Encounter (Signed)
Acknowledged  Saralyn Pilar, DO [facility] 08/14/2019, 5:37 PM

## 2019-09-24 ENCOUNTER — Ambulatory Visit: Payer: Medicare Other | Admitting: Pharmacist

## 2019-09-24 DIAGNOSIS — E785 Hyperlipidemia, unspecified: Secondary | ICD-10-CM

## 2019-09-24 NOTE — Chronic Care Management (AMB) (Signed)
  Care Management   Follow Up Note   09/24/2019 Name: Sharon Craig MRN: [id number] DOB: [birth year]  Referred by: Smitty Cords, DO Reason for referral : Chronic Care Management (Patient Phone Call)   Sharon Craig is a 69 y.o. year old female who is a primary care patient of Smitty Cords, DO. The CCM team was consulted for assistance with chronic disease management and care coordination needs by patient's health plan.   I reached out to patient by phone today to discuss patient's medication adherence to atorvastatin.  Review of patient status, including review of consultants reports, relevant laboratory and other test results, and collaboration with appropriate care team members and the patient's provider was performed as part of comprehensive patient evaluation and provision of chronic care management services.    SDOH (Social Determinants of Health) assessments performed: No See Care Plan activities for detailed interventions related to [facility])     Advanced Directives: See Care Plan and Vynca application for related entries.   Goals Addressed            This Visit's Progress   . PharmD - Medication Adherence       CARE PLAN ENTRY (see longitudinal plan of care for additional care plan information)   Current Barriers:  . Chronic Disease Management support, education, and care coordination needs related to HTN, HLD and hypothyroidism . Need for evaluation of medication adherence, as identified by patient's health plan, based on cholesterol medication adherence quality data  Pharmacist Clinical Goal(s):  Marland Kitchen Over the next 30 days, patient will work with CM Pharmacist to address needs related to medication adherence  Interventions: . Inter-disciplinary care team collaboration (see longitudinal plan of care) . Assess adherence to atorvastatin (as identified by health plan).  o Patient denies missed doses of atorvastatin 20 mg once daily.    o Review dispensing history in chart. Note Rx last filled on 07/22/19 for 90 day supply o Encourage patient to continue adherence to medication and discuss timing of medication administration to aid with adherence . Counsel patient on health plan over the counter benefit  . Agree to follow up with patient further another day as requested by patient.   Patient Self Care Activities:  . Patient to call provider office for new concerns or questions   Initial goal documentation        Plan  The care management team will reach out to the patient again over the next 14 days.   Duanne Moron, PharmD, [facility] Clinical Pharmacist [facility] [employer] [phone]

## 2019-09-24 NOTE — Patient Instructions (Signed)
Thank you allowing the Care Management Team to be a part of your care! It was a pleasure speaking with you today!     Care Management Team    Alto Denver RN, MSN, CCM Nurse Care Coordinator  [phone]   Duanne Moron PharmD  Clinical Pharmacist  [phone]   Dickie La LCSW Clinical Social Worker [phone]   Visit Information  Goals Addressed            This Visit's Progress   . PharmD - Medication Adherence       CARE PLAN ENTRY (see longitudinal plan of care for additional care plan information)   Current Barriers:  . Chronic Disease Management support, education, and care coordination needs related to HTN, HLD and hypothyroidism . Need for evaluation of medication adherence, as identified by patient's health plan, based on cholesterol medication adherence quality data  Pharmacist Clinical Goal(s):  Marland Kitchen Over the next 30 days, patient will work with CM Pharmacist to address needs related to medication adherence  Interventions: . Inter-disciplinary care team collaboration (see longitudinal plan of care) . Assess adherence to atorvastatin (as identified by health plan).  o Patient denies missed doses of atorvastatin 20 mg once daily.  o Review dispensing history in chart. Note Rx last filled on 07/22/19 for 90 day supply o Encourage patient to continue adherence to medication and discuss timing of medication administration to aid with adherence . Counsel patient on health plan over the counter benefit  . Agree to follow up with patient further another day as requested by patient.   Patient Self Care Activities:  . Patient to call provider office for new concerns or questions   Initial goal documentation        Patient verbalizes understanding of instructions provided today.   The care management team will reach out to the patient again over the next 14 days.   Duanne Moron, PharmD, [facility] Clinical Pharmacist [facility] Medical [employer] [phone]

## 2019-09-28 ENCOUNTER — Telehealth: Payer: Medicare Other

## 2019-09-28 ENCOUNTER — Ambulatory Visit: Payer: Self-pay | Admitting: Pharmacist

## 2019-09-28 NOTE — Chronic Care Management (AMB) (Signed)
  Care Management   Follow Up Note   09/28/2019 Name: Sharon Craig MRN: [id number] DOB: 1950/07/15  Referred by: Smitty Cords, DO Reason for referral : Chronic Care Management (Patient Phone Call)   Sharon Craig is a 69 y.o. year old female who is a primary care patient of Smitty Cords, DO. The care management team was consulted for assistance with care management and care coordination needs.    Was unable to reach patient via telephone today and have left HIPAA compliant voicemail asking patient to return my call.    Plan  The care management team will reach out to the patient again over the next 30 days.   Duanne Moron, PharmD, [facility] Clinical Pharmacist [facility] [employer] [phone]

## 2019-10-15 ENCOUNTER — Telehealth: Payer: Medicare Other

## 2019-10-15 ENCOUNTER — Ambulatory Visit: Payer: Self-pay | Admitting: Pharmacist

## 2019-10-15 ENCOUNTER — Other Ambulatory Visit: Payer: Self-pay | Admitting: Family Medicine

## 2019-10-15 DIAGNOSIS — I10 Essential (primary) hypertension: Secondary | ICD-10-CM

## 2019-10-15 DIAGNOSIS — E039 Hypothyroidism, unspecified: Secondary | ICD-10-CM

## 2019-10-15 NOTE — Chronic Care Management (AMB) (Signed)
  Care Management   Follow Up Note   10/15/2019 Name: [name] MRN: [id number] DOB: Jul 11, 1950  Referred by: Smitty Cords, DO Reason for referral : Chronic Care Management (Patient Phone Call)   Sharon Craig is a 69 y.o. year old female who is a primary care patient of Smitty Cords, DO. The care management team was consulted for assistance with care management and care coordination needs.    Was unable to reach patient via telephone today and have left HIPAA compliant voicemail asking patient to return my call. Outreach attempt #2.   Plan  The care management team will reach out to the patient again over the next 30 days.   Duanne Moron, PharmD, [facility] Clinical Pharmacist [facility] [employer] [phone]

## 2019-10-15 NOTE — Telephone Encounter (Signed)
Requested Prescriptions  Pending Prescriptions Disp Refills   amLODipine (NORVASC) 5 MG tablet [Pharmacy Med Name: AMLODIPINE BESYLATE 5MG  TABLETS] 90 tablet 1    Sig: TAKE 1 TABLET(5 MG) BY MOUTH DAILY     Cardiovascular:  Calcium Channel Blockers Passed - 10/15/2019  8:52 AM      Passed - Last BP in normal range    BP Readings from Last 1 Encounters:  08/10/19 116/67         Passed - Valid encounter within last 6 months    Recent Outpatient Visits          2 months ago Elevated hemoglobin A1c   [place], DO   8 months ago Annual physical exam   [place], DO   1 year ago Annual physical exam   [facility] [place], Devonne Doughty, DO   2 years ago Elevated hemoglobin A1c   [facility] [place], Devonne Doughty, DO   2 years ago Benign essential HTN   [place] J, DO              hydrochlorothiazide (HYDRODIURIL) 25 MG tablet [Pharmacy Med Name: HYDROCHLOROTHIAZIDE 25MG  TABLETS] 90 tablet 1    Sig: TAKE 1 TABLET BY MOUTH DAILY     Cardiovascular: Diuretics - Thiazide Passed - 10/15/2019  8:52 AM      Passed - Ca in normal range and within 360 days    Calcium  Date Value Ref Range Status  02/02/2019 9.3 8.6 - 10.4 mg/dL Final         Passed - Cr in normal range and within 360 days    Creat  Date Value Ref Range Status  02/02/2019 0.67 0.60 - 0.93 mg/dL Final    Comment:    For patients >41 years of age, the reference limit for Creatinine is approximately 13% higher for people identified as African-American. .          Passed - K in normal range and within 360 days    Potassium  Date Value Ref Range Status  02/02/2019 3.5 3.5 - 5.3 mmol/L Final         Passed - Na in normal range and within 360 days    Sodium  Date Value Ref Range Status  02/02/2019 139 135 - 146 mmol/L Final  09/02/2015 139 134 -  144 mmol/L Final         Passed - Last BP in normal range    BP Readings from Last 1 Encounters:  08/10/19 116/67         Passed - Valid encounter within last 6 months    Recent Outpatient Visits          2 months ago Elevated hemoglobin A1c   [place], DO   8 months ago Annual physical exam   [place], DO   1 year ago Annual physical exam   [place], DO   2 years ago Elevated hemoglobin A1c   [place], DO   2 years ago Benign essential HTN   [place] J, DO              levothyroxine (SYNTHROID) 25 MCG tablet [Pharmacy Med Name: LEVOTHYROXINE 0.025MG  (25MCG) TAB] 90 tablet 1  Sig: TAKE 1 TABLET(25 MCG) BY MOUTH DAILY     Endocrinology:  Hypothyroid Agents Failed - 10/15/2019  8:52 AM      Failed - TSH needs to be rechecked within 3 months after an abnormal result. Refill until TSH is due.      Passed - TSH in normal range and within 360 days    TSH  Date Value Ref Range Status  02/02/2019 3.00 0.40 - 4.50 mIU/L Final         Passed - Valid encounter within last 12 months    Recent Outpatient Visits          2 months ago Elevated hemoglobin A1c   [facility] [place], Netta Neat, DO   8 months ago Annual physical exam   [facility] Smitty Cords, DO   1 year ago Annual physical exam   [facility] [place], Netta Neat, DO   2 years ago Elevated hemoglobin A1c   [facility] [place], Netta Neat, DO   2 years ago Benign essential HTN   [facility] [place], Netta Neat, [place]

## 2019-11-02 ENCOUNTER — Ambulatory Visit: Payer: Self-pay | Admitting: Pharmacist

## 2019-11-02 ENCOUNTER — Telehealth: Payer: Medicare Other

## 2019-11-02 NOTE — Chronic Care Management (AMB) (Signed)
  Care Management   Follow Up Note   11/02/2019 Name: [name] MRN: [id number] DOB: 15-Dec-1950  Referred by: Smitty Cords, DO Reason for referral : Chronic Care Management (Patient Phone Call)   Sharon Craig is a 69 y.o. year old female who is a primary care patient of Smitty Cords, DO. The CCM team was consulted for assistance with chronic disease management and care coordination needs by patient's health plan.   I reached out to patient by phone today to discuss patient's medication adherence to atorvastatin.  Was unable to reach patient via telephone today and have left HIPAA compliant voicemail asking patient to return my call. Outreach attempt #3.   Plan  The care management team is available to follow up with the patient after provider conversation with the patient regarding recommendation for care management engagement and subsequent referral to the care management team.   Duanne Moron, PharmD, [facility] Clinical Pharmacist [facility] [employer] [phone]

## 2019-12-14 ENCOUNTER — Encounter: Payer: Self-pay | Admitting: Family Medicine

## 2019-12-14 ENCOUNTER — Ambulatory Visit (INDEPENDENT_AMBULATORY_CARE_PROVIDER_SITE_OTHER): Payer: Medicare Other | Admitting: Family Medicine

## 2019-12-14 ENCOUNTER — Other Ambulatory Visit: Payer: Self-pay

## 2019-12-14 VITALS — BP 120/82 | HR 70 | Temp 98.2°F | Resp 16 | Ht 68.0 in | Wt 184.0 lb

## 2019-12-14 DIAGNOSIS — F411 Generalized anxiety disorder: Secondary | ICD-10-CM

## 2019-12-14 DIAGNOSIS — Z636 Dependent relative needing care at home: Secondary | ICD-10-CM

## 2019-12-14 DIAGNOSIS — R413 Other amnesia: Secondary | ICD-10-CM | POA: Diagnosis not present

## 2019-12-14 DIAGNOSIS — F331 Major depressive disorder, recurrent, moderate: Secondary | ICD-10-CM | POA: Insufficient documentation

## 2019-12-14 DIAGNOSIS — F5104 Psychophysiologic insomnia: Secondary | ICD-10-CM | POA: Diagnosis not present

## 2019-12-14 MED ORDER — SERTRALINE HCL 50 MG PO TABS: 50.0000 mg | ORAL_TABLET | Freq: Every day | ORAL | 2 refills | Status: DC

## 2019-12-14 NOTE — Patient Instructions (Addendum)
Thank you for coming to the office today.  Please call us with your Moderna COVID19 vaccine card dates - that you received it. Thanks  Start Sertraline 50mg  daily with meal for mood / anxiety / stress / insomnia.  May take 3-4 weeks for it to take full effect - we may gradually increase dose.  Referral to our Chronic Care Management team - Pam and may be able to help with your stress and offer resources.   Please schedule a Follow-up Appointment to: Return in about 4 weeks (around 01/11/2020) for 4-6 weeks follow-up Mood/Anxiety PHQ9, GAD - AM apt for labs AFTER.  If you have any other questions or concerns, please feel free to call the office or send a message through MyChart. You may also schedule an earlier appointment if necessary.  Additionally, you may be receiving a survey about your experience at our office within a few days to 1 week by e-mail or mail. We value your feedback.  01/13/2020, DO [facility], [facility]

## 2019-12-14 NOTE — Progress Notes (Signed)
Subjective:    Patient ID: Sharon Craig, female    DOB: Jan 02, 1951, 69 y.o.   MRN: [id number]  Sharon Craig is a 69 y.o. female presenting on 12/14/2019 for Memory Loss (short term --this year is getting worst from past year that family had noticed--patient is concerened since family Hx of dementia)  Here with her son, Thereasa Distance, to provide additional history.  HPI   MEMORY LOSS Gradual worsening short term memory loss, forgetful, sometimes forget names and faces. Admits difficulty sleeping at night  For instance, had been in family members truck and then asked if she has ever ridden in the vehicle before. Admits stress, with her husband having health issues 3-4 years, worsening. Causes her a lot of concern, difficulty sleeping and she is worried. Describes caregiver stress Son admits she has negative mindset  She is not driving anymore. Has not driven in 4+ years. Financially cannot get new car. She is worried about safety.  Bothered because she cannot do the things she used to do.  Further discussion on her symptoms, patient and her son both report she experiences a lot of anxiety and she reports feeling depressed as a result. No prior major depression diagnosis.   Health Maintenance: UTD COVID19 vaccine Moderna.  Depression screen [facility] 2/9 12/14/2019 08/10/2019 02/09/2019  Decreased Interest 2 0 0  Down, Depressed, Hopeless 1 0 0  PHQ - 2 Score 3 0 0  Altered sleeping 2 - -  Tired, decreased energy 0 - -  Change in appetite 1 - -  Feeling bad or failure about yourself  1 - -  Trouble concentrating 0 - -  Moving slowly or fidgety/restless 1 - -  Suicidal thoughts 0 - -  PHQ-9 Score 8 - -  Difficult doing work/chores Not difficult at all - -   GAD 7 : Generalized Anxiety Score 12/14/2019  Nervous, Anxious, on Edge 2  Control/stop worrying 2  Worry too much - different things 1  Trouble relaxing 1  Restless 1  Easily annoyed or irritable 1  Afraid -  awful might happen 2  Total GAD 7 Score 10  Anxiety Difficulty Not difficult at all      Social History   Tobacco Use  . Smoking status: Never Smoker  . Smokeless tobacco: Never Used  Vaping Use  . Vaping Use: Never used  Substance Use Topics  . Alcohol use: No  . Drug use: No    Review of Systems Per HPI unless specifically indicated above     Objective:    BP 120/82 (BP Location: Left Arm, Patient Position: Sitting, Cuff Size: Normal)   Pulse 70   Temp 98.2 F (36.8 C)   Resp 16   Ht 5\' 8"  (1.727 m)   Wt 184 lb (83.5 kg)   BMI 27.98 kg/m   Wt Readings from Last 3 Encounters:  12/14/19 184 lb (83.5 kg)  08/10/19 190 lb 9.6 oz (86.5 kg)  02/09/19 185 lb (83.9 kg)    Physical Exam Vitals and nursing note reviewed.  Constitutional:      General: She is not in acute distress.    Appearance: She is well-developed. She is not diaphoretic.     Comments: Well-appearing, comfortable, cooperative  HENT:     Head: Normocephalic and atraumatic.  Eyes:     General:        Right eye: No discharge.        Left eye: No discharge.  Conjunctiva/sclera: Conjunctivae normal.  Cardiovascular:     Rate and Rhythm: Normal rate.  Pulmonary:     Effort: Pulmonary effort is normal.  Skin:    General: Skin is warm and dry.     Findings: No erythema or rash.  Neurological:     Mental Status: She is alert and oriented to person, place, and time.  Psychiatric:        Behavior: Behavior normal.     Comments: Well groomed, good eye contact, normal speech and thoughts       6CIT Screen 12/14/2019 01/17/2018 12/21/2016  What Year? 0 points 0 points 0 points  What month? 0 points 0 points 0 points  What time? 0 points 0 points 0 points  Count back from 20 0 points 0 points 0 points  Months in reverse 0 points 0 points 0 points  Repeat phrase 2 points 2 points 0 points  Total Score 2 2 0   No flowsheet data found.    Results for orders placed or performed in visit on  08/10/19  POCT HgB A1C  Result Value Ref Range   Hemoglobin A1C 5.8 (A) 4.0 - 5.6 %      Assessment & Plan:   Problem List Items Addressed This Visit    Psychophysiological insomnia   Relevant Medications   sertraline (ZOLOFT) 50 MG tablet   Memory loss   Major depressive disorder, recurrent, moderate (HCC) - Primary   Relevant Medications   sertraline (ZOLOFT) 50 MG tablet   GAD (generalized anxiety disorder)   Relevant Medications   sertraline (ZOLOFT) 50 MG tablet    Other Visit Diagnoses    Caregiver stress          Clinically with constellation of mood/psychological concerns at this time that seem to be impacting her short term memory. Newly diagnosed depression, anxiety and insomnia, with significant caregiver stress as she is caring for her ailing husband now for few years and he is declining. Patient has no prior diagnosis of dementia. Gradual decline with memory, mostly reported by family members, rather than patient. Significant PHQ, GAD scoring. No prior treatment for mental health  Labs labs 01/2019, has had normal TSH. No B12 testing. No other signs of easily reversible causes, no evidence of UTI or history.  Plan - Discussion on her current mood/anxiety/stress and memory symptoms - We mutually agree with patient and family to start addressing her psychological symptoms first to see if this improves her daily function and mental health. May improve her memory loss. - The memory loss described at this time, and based on mild 6CIT stable over past few years is consistent with mild cognitive impairment may be gradual onset and stable for age 45. No sign of overt dementia at this time. She remains functional in most areas iADL and ADL but she does not drive. - Start Sertraline 50mg  daily, may titrate up as needed, 75 to 100mg  in future - Close follow-up 4-6 weeks, monitor progress, will add other labs including repeat TSH and Vit B12 for memory loss work up - Consider  Neuro consultation in future if indicated - Future consider CCM referral for assistanc  Meds ordered this encounter  Medications  . sertraline (ZOLOFT) 50 MG tablet    Sig: Take 1 tablet (50 mg total) by mouth daily.    Dispense:  30 tablet    Refill:  2     Follow up plan: Return in about 4 weeks (around 01/11/2020) for 4-6 weeks  follow-up Mood/Anxiety PHQ9, GAD - AM apt for labs AFTER.   Saralyn Pilar, DO [facility] 12/14/2019, 11:36 AM

## 2020-01-11 ENCOUNTER — Other Ambulatory Visit: Payer: Self-pay | Admitting: Family Medicine

## 2020-01-11 DIAGNOSIS — E039 Hypothyroidism, unspecified: Secondary | ICD-10-CM

## 2020-01-13 ENCOUNTER — Other Ambulatory Visit: Payer: Self-pay | Admitting: Family Medicine

## 2020-01-13 DIAGNOSIS — I10 Essential (primary) hypertension: Secondary | ICD-10-CM

## 2020-01-13 DIAGNOSIS — E785 Hyperlipidemia, unspecified: Secondary | ICD-10-CM

## 2020-01-13 NOTE — Telephone Encounter (Signed)
Requested Prescriptions  Pending Prescriptions Disp Refills  . atorvastatin (LIPITOR) 20 MG tablet [Pharmacy Med Name: ATORVASTATIN 20MG  TABLETS] 90 tablet 0    Sig: TAKE 1 TABLET(20 MG) BY MOUTH AT BEDTIME     Cardiovascular:  Antilipid - Statins Failed - 01/13/2020  7:08 AM      Failed - HDL in normal range and within 360 days    HDL  Date Value Ref Range Status  02/02/2019 37 (L) > OR = 50 mg/dL Final  02/04/2019 47 40/11/6759 mg/dL Final    Comment:    According to ATP-III Guidelines, HDL-C >59 mg/dL is considered a negative risk factor for CHD.          Passed - Total Cholesterol in normal range and within 360 days    Cholesterol, Total  Date Value Ref Range Status  12/12/2014 162 100 - 199 mg/dL Final   Cholesterol  Date Value Ref Range Status  02/02/2019 135 <200 mg/dL Final         Passed - LDL in normal range and within 360 days    LDL Cholesterol (Calc)  Date Value Ref Range Status  02/02/2019 81 mg/dL (calc) Final    Comment:    Reference range: <100 . Desirable range <100 mg/dL for primary prevention;   <70 mg/dL for patients with CHD or diabetic patients  with > or = 2 CHD risk factors. 02/04/2019 LDL-C is now calculated using the Martin-Hopkins  calculation, which is a validated novel method providing  better accuracy than the Friedewald equation in the  estimation of LDL-C.  Marland Kitchen et al. Horald Pollen. Lenox Ahr): 2061-2068  (http://education.[employer].com/faq/FAQ164)          Passed - Triglycerides in normal range and within 360 days    Triglycerides  Date Value Ref Range Status  02/02/2019 89 <150 mg/dL Final         Passed - Patient is not pregnant      Passed - Valid encounter within last 12 months    Recent Outpatient Visits          1 month ago Major depressive disorder, recurrent, moderate (HCC)   [facility] [place], [place], DO   5 months ago Elevated hemoglobin A1c   [facility],  DO   11 months ago Annual physical exam   [facility], Althea Charon, DO   1 year ago Annual physical exam   [facility] [place], [place], DO   2 years ago Elevated hemoglobin A1c   [facility], Althea Charon, DO      Future Appointments            In 1 week Netta Neat, [place] Charon, DO [facility]           . metoprolol succinate (TOPROL-XL) 25 MG 24 hr tablet [Pharmacy Med Name: METOPROLOL ER SUCCINATE 25MG  TABS] 90 tablet 1    Sig: TAKE 1 TABLET(25 MG) BY MOUTH DAILY     Cardiovascular:  Beta Blockers Passed - 01/13/2020  7:08 AM      Passed - Last BP in normal range    BP Readings from Last 1 Encounters:  12/14/19 120/82         Passed - Last Heart Rate in normal range    Pulse Readings from Last 1 Encounters:  12/14/19 70         Passed - Valid encounter within  last 6 months    Recent Outpatient Visits          1 month ago Major depressive disorder, recurrent, moderate (HCC)   [facility] [place], Netta Neat, DO   5 months ago Elevated hemoglobin A1c   [facility] Smitty Cords, [place]   11 months ago Annual physical exam   [facility] Smitty Cords, DO   1 year ago Annual physical exam   [facility] [place], Netta Neat, DO   2 years ago Elevated hemoglobin A1c   [facility] Althea Charon, Netta Neat, DO      Future Appointments            In 1 week Althea Charon, Netta Neat, DO [facility]

## 2020-01-24 ENCOUNTER — Ambulatory Visit (INDEPENDENT_AMBULATORY_CARE_PROVIDER_SITE_OTHER): Payer: Medicare Other | Admitting: Family Medicine

## 2020-01-24 ENCOUNTER — Other Ambulatory Visit: Payer: Self-pay

## 2020-01-24 ENCOUNTER — Encounter: Payer: Self-pay | Admitting: Family Medicine

## 2020-01-24 VITALS — BP 119/63 | HR 68 | Temp 97.3°F | Resp 16 | Ht 68.0 in | Wt 182.6 lb

## 2020-01-24 DIAGNOSIS — F331 Major depressive disorder, recurrent, moderate: Secondary | ICD-10-CM | POA: Diagnosis not present

## 2020-01-24 DIAGNOSIS — E039 Hypothyroidism, unspecified: Secondary | ICD-10-CM | POA: Diagnosis not present

## 2020-01-24 DIAGNOSIS — F5104 Psychophysiologic insomnia: Secondary | ICD-10-CM

## 2020-01-24 DIAGNOSIS — F411 Generalized anxiety disorder: Secondary | ICD-10-CM | POA: Diagnosis not present

## 2020-01-24 DIAGNOSIS — Z23 Encounter for immunization: Secondary | ICD-10-CM | POA: Diagnosis not present

## 2020-01-24 DIAGNOSIS — R413 Other amnesia: Secondary | ICD-10-CM | POA: Diagnosis not present

## 2020-01-24 DIAGNOSIS — E785 Hyperlipidemia, unspecified: Secondary | ICD-10-CM

## 2020-01-24 DIAGNOSIS — R7309 Other abnormal glucose: Secondary | ICD-10-CM

## 2020-01-24 DIAGNOSIS — I10 Essential (primary) hypertension: Secondary | ICD-10-CM | POA: Diagnosis not present

## 2020-01-24 DIAGNOSIS — R718 Other abnormality of red blood cells: Secondary | ICD-10-CM

## 2020-01-24 MED ORDER — AMLODIPINE BESYLATE 5 MG PO TABS: ORAL_TABLET | ORAL | 1 refills | Status: DC

## 2020-01-24 MED ORDER — SERTRALINE HCL 50 MG PO TABS: 50.0000 mg | ORAL_TABLET | Freq: Every day | ORAL | 1 refills | Status: DC

## 2020-01-24 MED ORDER — HYDROCHLOROTHIAZIDE 25 MG PO TABS: 25.0000 mg | ORAL_TABLET | Freq: Every day | ORAL | 1 refills | Status: DC

## 2020-01-24 MED ORDER — ATORVASTATIN CALCIUM 20 MG PO TABS: ORAL_TABLET | ORAL | 1 refills | Status: DC

## 2020-01-24 NOTE — Assessment & Plan Note (Signed)
Controlled HTN overall - Home BP readings - stable No known complications  Plan:  1. Continue current Amlodipine 5mg daily, HCTZ 25mg daily, Metoprolol XL 25mg daily 2. Encourage improved lifestyle - low sodium diet, regular exercise 3. Continue monitor BP outside office, bring readings to next visit, if persistently >140/90 or new symptoms notify office sooner  

## 2020-01-24 NOTE — Progress Notes (Signed)
Subjective:    Patient ID: Sharon Craig, female    DOB: 11-05-1950, 69 y.o.   MRN: [id number]  Linah Klapper is a 69 y.o. female presenting on 01/24/2020 for Depression and Anxiety  Here with daughter in law, Elmarie Shiley.  HPI   Major Depression, moderate, recurrent / Generalized Anxiety Disorder MEMORY Loss  - Last visit with me 12/14/19, for initial visit for same problem memory loss / mood adjustment, treated with Sertraline 50mg , see prior notes for background information. - Interval update with family trying to offload some caregiver burden for her, she is caring for her husband who is ill, she has improved overall since last visit on new medicine Sertraline. Family notices improvement as well - Today patient reports improved day to day function and memory. She has improved mood and anxiety, still stress but able to handle it better, she is sleeping much better - Occasional missed dose on Sertraline may take later in day - No further episodes of significant memory concern but still has day to day forgetfulness - Due for labs She is not driving anymore. Has not driven in 4+ years. Financially cannot get new car. She is worried about safety. Improved now but still bothered because she cannot do the things she used to do.  Elevated A1c Due for lab, previous range 5-6  HYPERLIPIDEMIA: - Reports no concerns. Last lipid panel 01/2019, controlled  - Currently taking Atorvastatin 20mg , tolerating well without side effects or myalgias Due for labs today  Hypothyroidism History of low thyroid, on levothyroxine 02/2019 daily, doing well with it Due for lab, last check >6 month ago  CHRONIC HTN: Reports no concerns Current Meds - amlodipine 5mg  daily, HCTZ 25mg  daily, Metoprolol 25mg  XL daily   Reports good compliance, took meds today. Tolerating well, w/o complaints. Denies CP, dyspnea, HA, edema, dizziness / lightheadedness    Health Maintenance: Due for Flu Shot,  will receive today    Depression screen [facility] 2/9 01/24/2020 12/14/2019 08/10/2019  Decreased Interest 3 2 0  Down, Depressed, Hopeless 1 1 0  PHQ - 2 Score 4 3 0  Altered sleeping 1 2 -  Tired, decreased energy 0 0 -  Change in appetite 2 1 -  Feeling bad or failure about yourself  0 1 -  Trouble concentrating 0 0 -  Moving slowly or fidgety/restless 1 1 -  Suicidal thoughts 0 0 -  PHQ-9 Score 8 8 -  Difficult doing work/chores Not difficult at all Not difficult at all -   GAD 7 : Generalized Anxiety Score 01/24/2020 12/14/2019  Nervous, Anxious, on Edge 2 2  Control/stop worrying 2 2  Worry too much - different things 2 1  Trouble relaxing 0 1  Restless 0 1  Easily annoyed or irritable 1 1  Afraid - awful might happen 1 2  Total GAD 7 Score 8 10  Anxiety Difficulty Not difficult at all Not difficult at all     Social History   Tobacco Use  . Smoking status: Never Smoker  . Smokeless tobacco: Never Used  Vaping Use  . Vaping Use: Never used  Substance Use Topics  . Alcohol use: No  . Drug use: No    Review of Systems Per HPI unless specifically indicated above     Objective:    BP 119/63   Pulse 68   Temp (!) 97.3 F (36.3 C) (Temporal)   Resp 16   Ht 5\' 8"  (1.727 m)   Wt  182 lb 9.6 oz (82.8 kg)   SpO2 100%   BMI 27.76 kg/m   Wt Readings from Last 3 Encounters:  01/24/20 182 lb 9.6 oz (82.8 kg)  12/14/19 184 lb (83.5 kg)  08/10/19 190 lb 9.6 oz (86.5 kg)    Physical Exam Vitals and nursing note reviewed.  Constitutional:      General: She is not in acute distress.    Appearance: She is well-developed. She is not diaphoretic.     Comments: Well-appearing, comfortable, cooperative  HENT:     Head: Normocephalic and atraumatic.  Eyes:     General:        Right eye: No discharge.        Left eye: No discharge.     Conjunctiva/sclera: Conjunctivae normal.  Neck:     Thyroid: No thyromegaly.  Cardiovascular:     Rate and Rhythm: Normal rate and  regular rhythm.     Heart sounds: Normal heart sounds. No murmur heard.   Pulmonary:     Effort: Pulmonary effort is normal. No respiratory distress.     Breath sounds: Normal breath sounds. No wheezing or rales.  Musculoskeletal:        General: Normal range of motion.     Cervical back: Normal range of motion and neck supple.  Lymphadenopathy:     Cervical: No cervical adenopathy.  Skin:    General: Skin is warm and dry.     Findings: No erythema or rash.  Neurological:     Mental Status: She is alert and oriented to person, place, and time.  Psychiatric:        Behavior: Behavior normal.     Comments: Well groomed, good eye contact, normal speech and thoughts       Results for orders placed or performed in visit on 08/10/19  POCT HgB A1C  Result Value Ref Range   Hemoglobin A1C 5.8 (A) 4.0 - 5.6 %      Assessment & Plan:   Problem List Items Addressed This Visit    Psychophysiological insomnia   Relevant Medications   sertraline (ZOLOFT) 50 MG tablet   Memory loss   Relevant Orders   COMPLETE METABOLIC PANEL WITH GFR   Vitamin B12   Major depressive disorder, recurrent, moderate (HCC) - Primary   Relevant Medications   sertraline (ZOLOFT) 50 MG tablet   Low mean corpuscular volume (MCV)   Relevant Orders   CBC with Differential/Platelet   Hypothyroidism    Stable chronic problem Last TSH normal 01/2019 Continue current dose Levothyroxine daily  Check today lab panel      Relevant Orders   TSH   T4, free   GAD (generalized anxiety disorder)   Relevant Medications   sertraline (ZOLOFT) 50 MG tablet   Elevated hemoglobin A1c    Check A1c today Previously controlled mild PreDM      Relevant Orders   Hemoglobin A1c   Dyslipidemia    Controlled cholesterol on statin and lifestyle Last lipid panel mild low HDL, stable 2020  Plan: 1. Continue current meds - Atorvastatin 20mg  nightly 2. Encourage improved lifestyle - low carb/cholesterol, reduce  portion size, continue improving regular exercise  Check fasting lipids today      Relevant Medications   atorvastatin (LIPITOR) 20 MG tablet   Other Relevant Orders   COMPLETE METABOLIC PANEL WITH GFR   Lipid panel   Benign essential HTN    Controlled HTN overall - Home BP readings - stable  No known complications  Plan:  1. Continue current Amlodipine 5mg  daily, HCTZ 25mg  daily, Metoprolol XL 25mg  daily 2. Encourage improved lifestyle - low sodium diet, regular exercise 3. Continue monitor BP outside office, bring readings to next visit, if persistently >140/90 or new symptoms notify office sooner       Relevant Medications   hydrochlorothiazide (HYDRODIURIL) 25 MG tablet   atorvastatin (LIPITOR) 20 MG tablet   amLODipine (NORVASC) 5 MG tablet   Other Relevant Orders   COMPLETE METABOLIC PANEL WITH GFR    Other Visit Diagnoses    Needs flu shot       Relevant Orders   Flu Vaccine QUAD High Dose(Fluad) (Completed)     Improved mood/memory/sleep insomnia on SSRI See prior note Possible gradual cognitive decline vs forgetfulness/memory loss that is normal with age and can have worsening under stress as caregiver to husband - worse due to mood/anxiety seems to be primary cause at this time since improved on SSRI  Patient has no prior diagnosis of dementia Improved PHQ GAD Labs labs 01/2019, has had normal TSH. No B12 testing. No other signs of easily reversible causes, no evidence of UTI or history.  Plan - Continue Sertraline 50mg  daily, may adjust dose in future to 75 or 100mg  if indicated - Order labs today routine panel and include TSH, Vitamin B12 for memory  - Future consider CCM referral for assistanc    Meds ordered this encounter  Medications  . sertraline (ZOLOFT) 50 MG tablet    Sig: Take 1 tablet (50 mg total) by mouth daily.    Dispense:  90 tablet    Refill:  1    Change to 90 pill count  . hydrochlorothiazide (HYDRODIURIL) 25 MG tablet    Sig:  Take 1 tablet (25 mg total) by mouth daily.    Dispense:  90 tablet    Refill:  1  . atorvastatin (LIPITOR) 20 MG tablet    Sig: TAKE 1 TABLET(20 MG) BY MOUTH AT BEDTIME    Dispense:  90 tablet    Refill:  1  . amLODipine (NORVASC) 5 MG tablet    Sig: TAKE 1 TABLET(5 MG) BY MOUTH DAILY    Dispense:  90 tablet    Refill:  1      Follow up plan: Return in about 4 months (around 05/26/2020) for 4 month follow-up memory/mood PHQ.   , DO [facility] 01/24/2020, 9:04 AM

## 2020-01-24 NOTE — Patient Instructions (Addendum)
Thank you for coming to the office today.  Keep up the good work on Sertraline 50mg  daily, try to take in morning and keep it consistent. - In future, if needed a higher dose - you may decide to take ONE AND HALF for 75mg  daily or can go up to TWO pills at once daily for 100mg - Let me know if you adjust the dose.  DUE for FASTING BLOOD WORK (no food or drink after midnight before the lab appointment, only water or coffee without cream/sugar on the morning of)  TODAY   Please schedule a Follow-up Appointment to: Return in about 4 months (around 05/26/2020) for 4 month follow-up memory/mood PHQ.  If you have any other questions or concerns, please feel free to call the office or send a message through MyChart. You may also schedule an earlier appointment if necessary.  Additionally, you may be receiving a survey about your experience at our office within a few days to 1 week by e-mail or mail. We value your feedback.  , DO [facility], 07/24/2020

## 2020-01-24 NOTE — Assessment & Plan Note (Signed)
Stable chronic problem Last TSH normal 01/2019 Continue current dose Levothyroxine daily  Check today lab panel

## 2020-01-24 NOTE — Assessment & Plan Note (Signed)
Check A1c today Previously controlled mild PreDM

## 2020-01-24 NOTE — Assessment & Plan Note (Signed)
Controlled cholesterol on statin and lifestyle Last lipid panel mild low HDL, stable 2020  Plan: 1. Continue current meds - Atorvastatin 20mg  nightly 2. Encourage improved lifestyle - low carb/cholesterol, reduce portion size, continue improving regular exercise  Check fasting lipids today

## 2020-01-25 LAB — T4, FREE: Free T4: 1.2 ng/dL (ref 0.8–1.8)

## 2020-01-25 LAB — COMPLETE METABOLIC PANEL WITH GFR
AG Ratio: 1.5 (calc) (ref 1.0–2.5)
ALT: 13 U/L (ref 6–29)
AST: 17 U/L (ref 10–35)
Albumin: 4.5 g/dL (ref 3.6–5.1)
Alkaline phosphatase (APISO): 87 U/L (ref 37–153)
BUN: 14 mg/dL (ref 7–25)
CO2: 27 mmol/L (ref 20–32)
Calcium: 10.1 mg/dL (ref 8.6–10.4)
Chloride: 100 mmol/L (ref 98–110)
Creat: 0.83 mg/dL (ref 0.60–0.93)
GFR, Est African American: 82 mL/min/{1.73_m2} (ref >=60)
GFR, Est Non African American: 71 mL/min/{1.73_m2} (ref >=60)
Globulin: 3 g/dL (calc) (ref 1.9–3.7)
Glucose, Bld: 73 mg/dL (ref 65–99)
Potassium: 3.6 mmol/L (ref 3.5–5.3)
Sodium: 139 mmol/L (ref 135–146)
Total Bilirubin: 0.8 mg/dL (ref 0.2–1.2)
Total Protein: 7.5 g/dL (ref 6.1–8.1)

## 2020-01-25 LAB — CBC WITH DIFFERENTIAL/PLATELET
Absolute Monocytes: 686 cells/uL (ref 200–950)
Basophils Absolute: 32 cells/uL (ref 0–200)
Basophils Relative: 0.6 %
Eosinophils Absolute: 81 cells/uL (ref 15–500)
Eosinophils Relative: 1.5 %
HCT: 45 % (ref 35.0–45.0)
Hemoglobin: 13.5 g/dL (ref 11.7–15.5)
Lymphs Abs: 1609 cells/uL (ref 850–3900)
MCH: 21.6 pg — ABNORMAL LOW (ref 27.0–33.0)
MCHC: 30 g/dL — ABNORMAL LOW (ref 32.0–36.0)
MCV: 72.1 fL — ABNORMAL LOW (ref 80.0–100.0)
MPV: 11.1 fL (ref 7.5–12.5)
Monocytes Relative: 12.7 %
Neutro Abs: 2992 cells/uL (ref 1500–7800)
Neutrophils Relative %: 55.4 %
Platelets: 191 10*3/uL (ref 140–400)
RBC: 6.24 10*6/uL — ABNORMAL HIGH (ref 3.80–5.10)
RDW: 15.1 % — ABNORMAL HIGH (ref 11.0–15.0)
Total Lymphocyte: 29.8 %
WBC: 5.4 10*3/uL (ref 3.8–10.8)

## 2020-01-25 LAB — VITAMIN B12: Vitamin B-12: 362 pg/mL (ref 200–1100)

## 2020-01-25 LAB — LIPID PANEL
Cholesterol: 178 mg/dL (ref <200)
HDL: 54 mg/dL (ref >=50)
LDL Cholesterol (Calc): 107 mg/dL (calc) — ABNORMAL HIGH
Non-HDL Cholesterol (Calc): 124 mg/dL (calc) (ref <130)
Total CHOL/HDL Ratio: 3.3 (calc) (ref <5.0)
Triglycerides: 84 mg/dL (ref <150)

## 2020-01-25 LAB — HEMOGLOBIN A1C
Hgb A1c MFr Bld: 5.9 % of total Hgb — ABNORMAL HIGH (ref <5.7)
Mean Plasma Glucose: 123 (calc)
eAG (mmol/L): 6.8 (calc)

## 2020-01-25 LAB — TSH: TSH: 1.6 mIU/L (ref 0.40–4.50)

## 2020-01-28 NOTE — Progress Notes (Signed)
Patient notified

## 2020-02-05 ENCOUNTER — Telehealth: Payer: Self-pay

## 2020-02-05 ENCOUNTER — Ambulatory Visit: Payer: Medicare Other

## 2020-02-05 NOTE — Telephone Encounter (Signed)
This nurse attempted to call patient three times. On the third try she answered and stated that she is not able to do call at this time because she is awaiting another call that she needs to take. Stated that we will contact her in order to schedule for another time.

## 2020-02-26 ENCOUNTER — Other Ambulatory Visit: Payer: Self-pay | Admitting: Family Medicine

## 2020-02-26 DIAGNOSIS — Z1231 Encounter for screening mammogram for malignant neoplasm of breast: Secondary | ICD-10-CM

## 2020-03-15 ENCOUNTER — Other Ambulatory Visit: Payer: Self-pay | Admitting: Family Medicine

## 2020-03-15 NOTE — Telephone Encounter (Signed)
Requested Prescriptions  Pending Prescriptions Disp Refills  . fluticasone (FLONASE) 50 MCG/ACT nasal spray [Pharmacy Med Name: FLUTICASONE NASAL SP (120) RX] 48 g 1    Sig: SHAKE LIQUID AND USE 2 SPRAYS IN EACH NOSTRIL DAILY     Ear, Nose, and Throat: Nasal Preparations - Corticosteroids Passed - 03/15/2020 10:19 AM      Passed - Valid encounter within last 12 months    Recent Outpatient Visits          1 month ago Major depressive disorder, recurrent, moderate (HCC)   [facility], Netta Neat, DO   3 months ago Major depressive disorder, recurrent, moderate (HCC)   [facility], Netta Neat, DO   7 months ago Elevated hemoglobin A1c   [facility] Smitty Cords, DO   1 year ago Annual physical exam   [facility] Smitty Cords, DO   2 years ago Annual physical exam   [facility] Smitty Cords, DO      Future Appointments            In 2 months Althea Charon, Netta Neat, DO [facility], [facility]

## 2020-04-20 ENCOUNTER — Other Ambulatory Visit: Payer: Self-pay | Admitting: Family Medicine

## 2020-04-20 DIAGNOSIS — E785 Hyperlipidemia, unspecified: Secondary | ICD-10-CM

## 2020-04-20 DIAGNOSIS — I10 Essential (primary) hypertension: Secondary | ICD-10-CM

## 2020-05-14 ENCOUNTER — Other Ambulatory Visit: Payer: Self-pay | Admitting: Family Medicine

## 2020-05-14 ENCOUNTER — Ambulatory Visit
Admission: RE | Admit: 2020-05-14 | Discharge: 2020-05-14 | Disposition: A | Discharge status: Home / Self Care | Payer: Medicare Other | Source: Ambulatory Visit | Attending: Family Medicine | Admitting: Family Medicine

## 2020-05-14 ENCOUNTER — Other Ambulatory Visit: Payer: Self-pay

## 2020-05-14 DIAGNOSIS — N6320 Unspecified lump in the left breast, unspecified quadrant: Secondary | ICD-10-CM

## 2020-05-14 DIAGNOSIS — Z1231 Encounter for screening mammogram for malignant neoplasm of breast: Secondary | ICD-10-CM | POA: Insufficient documentation

## 2020-05-14 DIAGNOSIS — R928 Other abnormal and inconclusive findings on diagnostic imaging of breast: Secondary | ICD-10-CM

## 2020-05-22 ENCOUNTER — Ambulatory Visit: Payer: Medicare Other

## 2020-05-22 ENCOUNTER — Other Ambulatory Visit: Payer: Medicare Other

## 2020-05-29 ENCOUNTER — Ambulatory Visit: Payer: Medicare Other | Admitting: Family Medicine

## 2020-06-06 ENCOUNTER — Other Ambulatory Visit: Payer: Medicare Other

## 2020-06-06 ENCOUNTER — Ambulatory Visit: Payer: Medicare Other

## 2020-06-13 ENCOUNTER — Ambulatory Visit (INDEPENDENT_AMBULATORY_CARE_PROVIDER_SITE_OTHER): Payer: Medicare Other | Admitting: Family Medicine

## 2020-06-13 ENCOUNTER — Encounter: Payer: Self-pay | Admitting: Family Medicine

## 2020-06-13 ENCOUNTER — Other Ambulatory Visit: Payer: Self-pay

## 2020-06-13 ENCOUNTER — Other Ambulatory Visit: Payer: Self-pay | Admitting: Family Medicine

## 2020-06-13 VITALS — BP 136/70 | HR 58 | Ht 68.0 in | Wt 176.6 lb

## 2020-06-13 DIAGNOSIS — E039 Hypothyroidism, unspecified: Secondary | ICD-10-CM

## 2020-06-13 DIAGNOSIS — F331 Major depressive disorder, recurrent, moderate: Secondary | ICD-10-CM

## 2020-06-13 DIAGNOSIS — F411 Generalized anxiety disorder: Secondary | ICD-10-CM

## 2020-06-13 DIAGNOSIS — F5104 Psychophysiologic insomnia: Secondary | ICD-10-CM

## 2020-06-13 DIAGNOSIS — E785 Hyperlipidemia, unspecified: Secondary | ICD-10-CM

## 2020-06-13 DIAGNOSIS — I10 Essential (primary) hypertension: Secondary | ICD-10-CM

## 2020-06-13 DIAGNOSIS — R7309 Other abnormal glucose: Secondary | ICD-10-CM

## 2020-06-13 DIAGNOSIS — Z0000 Encounter for general adult medical examination without abnormal findings: Secondary | ICD-10-CM

## 2020-06-13 MED ORDER — LEVOTHYROXINE SODIUM 25 MCG PO TABS: 25.0000 ug | ORAL_TABLET | Freq: Every day | ORAL | 1 refills | Status: DC

## 2020-06-13 MED ORDER — TRAZODONE HCL 50 MG PO TABS: 50.0000 mg | ORAL_TABLET | Freq: Every day | ORAL | 1 refills | Status: DC

## 2020-06-13 MED ORDER — METOPROLOL SUCCINATE ER 25 MG PO TB24: 25.0000 mg | ORAL_TABLET | Freq: Every day | ORAL | 1 refills | Status: DC

## 2020-06-13 MED ORDER — AMLODIPINE BESYLATE 5 MG PO TABS
ORAL_TABLET | ORAL | 1 refills | Status: DC
Start: 2020-06-13 — End: 2020-12-05

## 2020-06-13 MED ORDER — SERTRALINE HCL 50 MG PO TABS: 50.0000 mg | ORAL_TABLET | Freq: Every day | ORAL | 1 refills | Status: DC

## 2020-06-13 MED ORDER — HYDROCHLOROTHIAZIDE 25 MG PO TABS: 25.0000 mg | ORAL_TABLET | Freq: Every day | ORAL | 1 refills | Status: DC

## 2020-06-13 MED ORDER — ATORVASTATIN CALCIUM 20 MG PO TABS
ORAL_TABLET | ORAL | 1 refills | Status: DC
Start: 2020-06-13 — End: 2020-12-05

## 2020-06-13 NOTE — Assessment & Plan Note (Signed)
Controlled HTN overall - Home BP readings - stable No known complications  Plan:  1. Continue current Amlodipine 5mg  daily, HCTZ 25mg  daily, Metoprolol XL 25mg  daily 2. Encourage improved lifestyle - low sodium diet, regular exercise 3. Continue monitor BP outside office, bring readings to next visit, if persistently >140/90 or new symptoms notify office sooner

## 2020-06-13 NOTE — Assessment & Plan Note (Signed)
Secondary to mood / complication Primary caregiver burden and stress with her husband On SSRI

## 2020-06-13 NOTE — Assessment & Plan Note (Signed)
Stable chronic problem Last TSH normal 01/2020 Continue current dose Levothyroxine daily  Check today lab panel for thyroid in 6 months

## 2020-06-13 NOTE — Assessment & Plan Note (Signed)
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

## 2020-06-13 NOTE — Assessment & Plan Note (Signed)
See A&P Depression On SSRI Add Trazodone 50mg  nightly for mood and sleep

## 2020-06-13 NOTE — Progress Notes (Signed)
Subjective:    Patient ID: Sharon Craig, female    DOB: 06/16/1950, 70 y.o.   MRN: [id number]  Sharon Craig is a 70 y.o. female presenting on 06/13/2020 for Depression and Memory Loss (Says she has been doing ok.Marland Kitchen )   HPI   Major Depression, moderate, recurrent / Generalized Anxiety Disorder MEMORY Loss  - Last visit with me 01/2020, for  visit for same problem memory loss follow-up, treated with lab results for dementia work up, see prior notes for background information. - Interval update with lab results were normal, no other cause of memory loss and all results were normal. - Today patient reports she is doing well Still caregiver for her husband, she spends most of her time helping him and worrying about him.  She has not had any worsening mind or mood issues since last visit. Admits some worse insomnia not always sleeping well at night, can wake up. She asks about sleeping medication or adjusting her mood med. - Her memory is affecting her still but she feels it is unchanged and normal. - No further episodes of significant memory concern but still has day to day forgetfulness  Elevated A1c Due for lab, previous range 5-6  HYPERLIPIDEMIA: - Reports no concerns. Last lipid panel 01/2020, controlled  - Currently taking Atorvastatin 20mg , tolerating well without side effects or myalgias Due for labs today  Hypothyroidism History of low thyroid, on levothyroxine daily, doing well with it Last lab 01/2020 normal thyroid panel  CHRONIC HTN: Reports no concerns. Stopped Amlodipine rx ran out Current Meds - amlodipine 5mg  daily (OUT) , HCTZ 25mg  daily, Metoprolol 25mg  XL daily   Reports good compliance, took meds today. Tolerating well, w/o complaints. Denies CP, dyspnea, HA, edema, dizziness / lightheadedness    Depression screen [facility] 2/9 06/13/2020 01/24/2020 12/14/2019  Decreased Interest 3 3 2   Down, Depressed, Hopeless 2 1 1   PHQ - 2 Score 5 4  3   Altered sleeping 0 1 2  Tired, decreased energy 0 0 0  Change in appetite 0 2 1  Feeling bad or failure about yourself  0 0 1  Trouble concentrating 0 0 0  Moving slowly or fidgety/restless 0 1 1  Suicidal thoughts 0 0 0  PHQ-9 Score 5 8 8   Difficult doing work/chores Not difficult at all Not difficult at all Not difficult at all   GAD 7 : Generalized Anxiety Score 06/13/2020 01/24/2020 12/14/2019  Nervous, Anxious, on Edge 1 2 2   Control/stop worrying 1 2 2   Worry too much - different things 2 2 1   Trouble relaxing 0 0 1  Restless 0 0 1  Easily annoyed or irritable 0 1 1  Afraid - awful might happen 1 1 2   Total GAD 7 Score 5 8 10   Anxiety Difficulty Not difficult at all Not difficult at all Not difficult at all      Social History   Tobacco Use  . Smoking status: Never Smoker  . Smokeless tobacco: Never Used  Vaping Use  . Vaping Use: Never used  Substance Use Topics  . Alcohol use: No  . Drug use: No    Review of Systems Per HPI unless specifically indicated above     Objective:    BP 136/70   Pulse (!) 58   Ht 5\' 8"  (1.727 m)   Wt 176 lb 9.6 oz (80.1 kg)   SpO2 97%   BMI 26.85 kg/m   Wt Readings from Last  3 Encounters:  06/13/20 176 lb 9.6 oz (80.1 kg)  01/24/20 182 lb 9.6 oz (82.8 kg)  12/14/19 184 lb (83.5 kg)    Physical Exam Vitals and nursing note reviewed.  Constitutional:      General: She is not in acute distress.    Appearance: She is well-developed and well-nourished. She is not diaphoretic.     Comments: Well-appearing, comfortable, cooperative  HENT:     Head: Normocephalic and atraumatic.     Mouth/Throat:     Mouth: Oropharynx is clear and moist.  Eyes:     General:        Right eye: No discharge.        Left eye: No discharge.     Conjunctiva/sclera: Conjunctivae normal.  Neck:     Thyroid: No thyromegaly.  Cardiovascular:     Rate and Rhythm: Normal rate and regular rhythm.     Pulses: Intact distal pulses.     Heart  sounds: Normal heart sounds. No murmur heard.   Pulmonary:     Effort: Pulmonary effort is normal. No respiratory distress.     Breath sounds: Normal breath sounds. No wheezing or rales.  Musculoskeletal:        General: No edema. Normal range of motion.     Cervical back: Normal range of motion and neck supple.  Lymphadenopathy:     Cervical: No cervical adenopathy.  Skin:    General: Skin is warm and dry.     Findings: No erythema or rash.  Neurological:     Mental Status: She is alert and oriented to person, place, and time.  Psychiatric:        Mood and Affect: Mood and affect normal.        Behavior: Behavior normal.     Comments: Well groomed, good eye contact, normal speech and thoughts        Results for orders placed or performed in visit on 01/24/20  Hemoglobin A1c  Result Value Ref Range   Hgb A1c MFr Bld 5.9 (H) <5.7 % of total Hgb   Mean Plasma Glucose 123 (calc)   eAG (mmol/L) 6.8 (calc)  CBC with Differential/Platelet  Result Value Ref Range   WBC 5.4 3.8 - 10.8 Thousand/uL   RBC 6.24 (H) 3.80 - 5.10 Million/uL   Hemoglobin 13.5 11.7 - 15.5 g/dL   HCT 16.145.0 09.635.0 - 04.545.0 %   MCV 72.1 (L) 80.0 - 100.0 fL   MCH 21.6 (L) 27.0 - 33.0 pg   MCHC 30.0 (L) 32.0 - 36.0 g/dL   RDW 40.915.1 (H) 81.111.0 - 91.415.0 %   Platelets 191 140 - 400 Thousand/uL   MPV 11.1 7.5 - 12.5 fL   Neutro Abs 2,992 1,500 - 7,800 cells/uL   Lymphs Abs 1,609 850 - 3,900 cells/uL   Absolute Monocytes 686 200 - 950 cells/uL   Eosinophils Absolute 81 15 - 500 cells/uL   Basophils Absolute 32 0 - 200 cells/uL   Neutrophils Relative % 55.4 %   Total Lymphocyte 29.8 %   Monocytes Relative 12.7 %   Eosinophils Relative 1.5 %   Basophils Relative 0.6 %  COMPLETE METABOLIC PANEL WITH GFR  Result Value Ref Range   Glucose, Bld 73 65 - 99 mg/dL   BUN 14 7 - 25 mg/dL   Creat 7.820.83 9.560.60 - 2.130.93 mg/dL   GFR, Est Non African American 71 > OR = 60 mL/min/1.4773m2   GFR, Est African American 82 > OR = 60  mL/min/1.48m2   BUN/Creatinine Ratio NOT APPLICABLE 6 - 22 (calc)   Sodium 139 135 - 146 mmol/L   Potassium 3.6 3.5 - 5.3 mmol/L   Chloride 100 98 - 110 mmol/L   CO2 27 20 - 32 mmol/L   Calcium 10.1 8.6 - 10.4 mg/dL   Total Protein 7.5 6.1 - 8.1 g/dL   Albumin 4.5 3.6 - 5.1 g/dL   Globulin 3.0 1.9 - 3.7 g/dL (calc)   AG Ratio 1.5 1.0 - 2.5 (calc)   Total Bilirubin 0.8 0.2 - 1.2 mg/dL   Alkaline phosphatase (APISO) 87 37 - 153 U/L   AST 17 10 - 35 U/L   ALT 13 6 - 29 U/L  Lipid panel  Result Value Ref Range   Cholesterol 178 <200 mg/dL   HDL 54 > OR = 50 mg/dL   Triglycerides 84 <160 mg/dL   LDL Cholesterol (Calc) 107 (H) mg/dL (calc)   Total CHOL/HDL Ratio 3.3 <5.0 (calc)   Non-HDL Cholesterol (Calc) 124 <130 mg/dL (calc)  Vitamin V37  Result Value Ref Range   Vitamin B-12 362 200 - 1,100 pg/mL  TSH  Result Value Ref Range   TSH 1.60 0.40 - 4.50 mIU/L  T4, free  Result Value Ref Range   Free T4 1.2 0.8 - 1.8 ng/dL      Assessment & Plan:   Problem List Items Addressed This Visit    Psychophysiological insomnia    See A&P Depression On SSRI Add Trazodone 50mg  nightly for mood and sleep      Relevant Medications   sertraline (ZOLOFT) 50 MG tablet   traZODone (DESYREL) 50 MG tablet   Major depressive disorder, recurrent, moderate (HCC) - Primary    Chronic recurrent moderate depression 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, affecting her significantly Mood stable to improved on SSRI, some variance with her taking the med Refill Sertraline 50mg  daily, add Trazodone 50mg  nightly for insomnia      Relevant Medications   sertraline (ZOLOFT) 50 MG tablet   traZODone (DESYREL) 50 MG tablet   Hypothyroidism    Stable chronic problem Last TSH normal 01/2020 Continue current dose Levothyroxine daily  Check today lab panel for thyroid in 6 months      Relevant Medications   levothyroxine (SYNTHROID) 25 MCG tablet   metoprolol succinate  (TOPROL-XL) 25 MG 24 hr tablet   GAD (generalized anxiety disorder)    Secondary to mood / complication Primary caregiver burden and stress with her husband On SSRI      Relevant Medications   sertraline (ZOLOFT) 50 MG tablet   traZODone (DESYREL) 50 MG tablet   Dyslipidemia   Relevant Medications   atorvastatin (LIPITOR) 20 MG tablet   Benign essential HTN    Controlled HTN overall - Home BP readings - stable No known complications  Plan:  1. Continue current Amlodipine 5mg  daily, HCTZ 25mg  daily, Metoprolol XL 25mg  daily 2. Encourage improved lifestyle - low sodium diet, regular exercise 3. Continue monitor BP outside office, bring readings to next visit, if persistently >140/90 or new symptoms notify office sooner       Relevant Medications   amLODipine (NORVASC) 5 MG tablet   hydrochlorothiazide (HYDRODIURIL) 25 MG tablet   atorvastatin (LIPITOR) 20 MG tablet   metoprolol succinate (TOPROL-XL) 25 MG 24 hr tablet      Meds ordered this encounter  Medications  . sertraline (ZOLOFT) 50 MG tablet    Sig: Take 1 tablet (50  mg total) by mouth daily.    Dispense:  90 tablet    Refill:  1  . amLODipine (NORVASC) 5 MG tablet    Sig: TAKE 1 TABLET(5 MG) BY MOUTH DAILY    Dispense:  90 tablet    Refill:  1  . traZODone (DESYREL) 50 MG tablet    Sig: Take 1 tablet (50 mg total) by mouth at bedtime.    Dispense:  90 tablet    Refill:  1  . levothyroxine (SYNTHROID) 25 MCG tablet    Sig: Take 1 tablet (25 mcg total) by mouth daily before breakfast.    Dispense:  90 tablet    Refill:  1  . hydrochlorothiazide (HYDRODIURIL) 25 MG tablet    Sig: Take 1 tablet (25 mg total) by mouth daily.    Dispense:  90 tablet    Refill:  1  . atorvastatin (LIPITOR) 20 MG tablet    Sig: TAKE 1 TABLET(20 MG) BY MOUTH AT BEDTIME    Dispense:  90 tablet    Refill:  1  . metoprolol succinate (TOPROL-XL) 25 MG 24 hr tablet    Sig: Take 1 tablet (25 mg total) by mouth daily.    Dispense:   90 tablet    Refill:  1      Follow up plan: Return in about 6 months (around 12/11/2020) for 6 month follow-up fasting lab only then 1 week later Annual Physical.  Future labs ordered for 12/05/20  Saralyn Pilar, DO [facility] 06/13/2020, 11:35 AM

## 2020-06-13 NOTE — Patient Instructions (Addendum)
Thank you for coming to the office today.  Refilled all medications for 90 day with 1 refill good for 6 months.  Make sure to take Sertraline ZOloft 50mg  daily in MORNING WITH FOOD consistently for mood and mind.  We will add Trazodone 50mg  nightly at bedtime for mood and SLEEP.  DUE for FASTING BLOOD WORK (no food or drink after midnight before the lab appointment, only water or coffee without cream/sugar on the morning of)  SCHEDULE "Lab Only" visit in the morning at the clinic for lab draw in 6 MONTHS   - Make sure Lab Only appointment is at about 1 week before your next appointment, so that results will be available  For Lab Results, once available within 2-3 days of blood draw, you can can log in to MyChart online to view your results and a brief explanation. Also, we can discuss results at next follow-up visit.   Please schedule a Follow-up Appointment to: Return in about 6 months (around 12/11/2020) for 6 month follow-up fasting lab only then 1 week later Annual Physical.  If you have any other questions or concerns, please feel free to call the office or send a message through MyChart. You may also schedule an earlier appointment if necessary.  Additionally, you may be receiving a survey about your experience at our office within a few days to 1 week by e-mail or mail. We value your feedback.  , DO [facility], Saralyn Pilar

## 2020-06-16 ENCOUNTER — Ambulatory Visit
Admission: RE | Admit: 2020-06-16 | Discharge: 2020-06-16 | Disposition: A | Discharge status: Home / Self Care | Payer: Medicare Other | Source: Ambulatory Visit | Attending: Family Medicine | Admitting: Family Medicine

## 2020-06-16 ENCOUNTER — Other Ambulatory Visit: Payer: Self-pay

## 2020-06-16 DIAGNOSIS — N6320 Unspecified lump in the left breast, unspecified quadrant: Secondary | ICD-10-CM | POA: Diagnosis not present

## 2020-06-16 DIAGNOSIS — R922 Inconclusive mammogram: Secondary | ICD-10-CM | POA: Diagnosis not present

## 2020-06-16 DIAGNOSIS — R928 Other abnormal and inconclusive findings on diagnostic imaging of breast: Secondary | ICD-10-CM

## 2020-07-20 ENCOUNTER — Other Ambulatory Visit: Payer: Self-pay | Admitting: Family Medicine

## 2020-07-20 DIAGNOSIS — I10 Essential (primary) hypertension: Secondary | ICD-10-CM

## 2020-07-28 DIAGNOSIS — H2513 Age-related nuclear cataract, bilateral: Secondary | ICD-10-CM | POA: Diagnosis not present

## 2020-07-28 DIAGNOSIS — H1045 Other chronic allergic conjunctivitis: Secondary | ICD-10-CM | POA: Diagnosis not present

## 2020-07-28 DIAGNOSIS — H35341 Macular cyst, hole, or pseudohole, right eye: Secondary | ICD-10-CM | POA: Diagnosis not present

## 2020-07-29 ENCOUNTER — Telehealth: Payer: Self-pay | Admitting: Family Medicine

## 2020-07-29 NOTE — Telephone Encounter (Signed)
Copied from CRM [id number]. Topic: Medicare AWV >> Jul 29, 2020  2:55 PM Claudette Laws R wrote: Reason for CRM:  Left message for patient to call back and schedule the Medicare Annual Wellness Visit (AWV) virtually or by telephone.  Last AWV 01/23/2019  Please schedule at anytime with [facility].  40 minute appointment  Any questions, please call me at [phone]

## 2020-07-30 ENCOUNTER — Telehealth: Payer: Self-pay

## 2020-07-30 NOTE — Telephone Encounter (Signed)
Copied from CRM [id number]. Topic: Medicare AWV >> Jul 29, 2020  2:55 PM Claudette Laws R wrote: Reason for CRM:  Left message for patient to call back and schedule the Medicare Annual Wellness Visit (AWV) virtually or by telephone.  Last AWV 01/23/2019  Please schedule at anytime with [facility].  40 minute appointment  Any questions, please call me at [phone] >> Jul 29, 2020  3:33 PM Elliot Gault wrote: Patient will call back to schedule appointment.

## 2020-10-16 ENCOUNTER — Other Ambulatory Visit: Payer: Self-pay | Admitting: Family Medicine

## 2020-10-16 DIAGNOSIS — I10 Essential (primary) hypertension: Secondary | ICD-10-CM

## 2020-10-16 NOTE — Telephone Encounter (Signed)
Future visit in 1 month  

## 2020-12-04 ENCOUNTER — Encounter: Payer: Medicare Other | Admitting: Family Medicine

## 2020-12-05 ENCOUNTER — Encounter: Payer: Medicare Other | Admitting: Family Medicine

## 2020-12-05 ENCOUNTER — Encounter: Payer: Self-pay | Admitting: Family Medicine

## 2020-12-05 ENCOUNTER — Other Ambulatory Visit: Payer: Medicare Other

## 2020-12-05 ENCOUNTER — Ambulatory Visit (INDEPENDENT_AMBULATORY_CARE_PROVIDER_SITE_OTHER): Payer: Medicare Other | Admitting: Family Medicine

## 2020-12-05 ENCOUNTER — Other Ambulatory Visit: Payer: Self-pay

## 2020-12-05 VITALS — BP 115/62 | HR 57 | Ht 68.0 in | Wt 168.2 lb

## 2020-12-05 DIAGNOSIS — R7309 Other abnormal glucose: Secondary | ICD-10-CM | POA: Diagnosis not present

## 2020-12-05 DIAGNOSIS — Z0000 Encounter for general adult medical examination without abnormal findings: Secondary | ICD-10-CM | POA: Diagnosis not present

## 2020-12-05 DIAGNOSIS — E785 Hyperlipidemia, unspecified: Secondary | ICD-10-CM

## 2020-12-05 DIAGNOSIS — F331 Major depressive disorder, recurrent, moderate: Secondary | ICD-10-CM

## 2020-12-05 DIAGNOSIS — E039 Hypothyroidism, unspecified: Secondary | ICD-10-CM

## 2020-12-05 DIAGNOSIS — F411 Generalized anxiety disorder: Secondary | ICD-10-CM

## 2020-12-05 DIAGNOSIS — F5104 Psychophysiologic insomnia: Secondary | ICD-10-CM

## 2020-12-05 DIAGNOSIS — I10 Essential (primary) hypertension: Secondary | ICD-10-CM

## 2020-12-05 MED ORDER — AMLODIPINE BESYLATE 5 MG PO TABS: ORAL_TABLET | ORAL | 3 refills | Status: DC

## 2020-12-05 MED ORDER — LEVOTHYROXINE SODIUM 25 MCG PO TABS: 25.0000 ug | ORAL_TABLET | Freq: Every day | ORAL | 3 refills | Status: DC

## 2020-12-05 MED ORDER — HYDROCHLOROTHIAZIDE 25 MG PO TABS: 25.0000 mg | ORAL_TABLET | Freq: Every day | ORAL | 3 refills | Status: DC

## 2020-12-05 MED ORDER — SERTRALINE HCL 50 MG PO TABS
50.0000 mg | ORAL_TABLET | Freq: Every day | ORAL | 3 refills | Status: DC
Start: 2020-12-05 — End: 2022-02-01

## 2020-12-05 MED ORDER — METOPROLOL SUCCINATE ER 25 MG PO TB24
25.0000 mg | ORAL_TABLET | Freq: Every day | ORAL | 3 refills | Status: DC
Start: 2020-12-05 — End: 2021-12-18

## 2020-12-05 MED ORDER — ATORVASTATIN CALCIUM 20 MG PO TABS: ORAL_TABLET | ORAL | 3 refills | Status: DC

## 2020-12-05 NOTE — Progress Notes (Signed)
Subjective:    Patient ID: Sharon Craig, female    DOB: 11/29/1950, 70 y.o.   MRN: [id number]  Sharon Craig is a 70 y.o. 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  She has not had any worsening mind or mood issues since last visit. Admits some worse insomnia not always sleeping well at night, can wake up. She asks about sleeping medication or adjusting her mood med. - Her memory is affecting her still but she feels it is unchanged and normal. - No further episodes of significant memory concern but still has day to day forgetfulness   Elevated A1c Due for lab, previous range 5-6   HYPERLIPIDEMIA: - Reports no concerns. Last lipid panel 01/2020, controlled  - Currently taking Atorvastatin 20mg , tolerating well without side effects or myalgias Due for labs today   Hypothyroidism History of low thyroid, on levothyroxine 25mcg daily, doing well with it Last lab 01/2020 normal thyroid panel   CHRONIC HTN: Reports no concerns.  Current Meds - amlodipine 5mg  daily , HCTZ 25mg  daily, Metoprolol 25mg  XL daily   Reports good compliance, took meds today. Tolerating well, w/o complaints. Denies CP, dyspnea, HA, edema, dizziness / lightheadedness    Depression screen [facility] 2/9 12/05/2020 06/13/2020 01/24/2020  Decreased Interest 1 3 3   Down, Depressed, Hopeless 1 2 1   PHQ - 2 Score 2 5 4   Altered sleeping 0 0 1  Tired, decreased energy 0 0 0  Change in appetite 0 0 2  Feeling bad or failure about yourself  0 0 0  Trouble concentrating 0 0 0  Moving slowly or fidgety/restless 0 0 1  Suicidal thoughts 0 0 0  PHQ-9 Score 2 5 8   Difficult doing work/chores Not difficult at all Not difficult at all Not difficult  at all   GAD 7 : Generalized Anxiety Score 06/13/2020 01/24/2020 12/14/2019  Nervous, Anxious, on Edge 1 2 2   Control/stop worrying 1 2 2   Worry too much - different things 2 2 1   Trouble relaxing 0 0 1  Restless 0 0 1  Easily annoyed or irritable 0 1 1  Afraid - awful might happen 1 1 2   Total GAD 7 Score 5 8 10   Anxiety Difficulty Not difficult at all Not difficult at all Not difficult at all      Past Medical History:  Diagnosis Date   Hyperlipidemia    Hypothyroidism    Past Surgical History:  Procedure Laterality Date   ABDOMINAL HYSTERECTOMY     COLONOSCOPY WITH PROPOFOL N/A 12/05/2014   Procedure: COLONOSCOPY WITH PROPOFOL;  Surgeon: Wallace CullensPaul Y Oh, MD;  Location: [facility] ENDOSCOPY;  Service: Gastroenterology;  Laterality: N/A;   KNEE SURGERY Left    TUBAL LIGATION     Social History   Socioeconomic History   Marital status: Married    Spouse name: Not on file   Number of children: Not on file   Years of education: Not on file   Highest education level: 12th grade  Occupational History   Not on file  Tobacco Use   Smoking status: Never   Smokeless tobacco: Never  Vaping Use   Vaping Use: Never used  Substance and Sexual Activity   Alcohol use: No  Drug use: No   Sexual activity: Not on file  Other Topics Concern   Not on file  Social History Narrative   Not on file   Social Determinants of Health   Financial Resource Strain: Not on file  Food Insecurity: Not on file  Transportation Needs: Not on file  Physical Activity: Not on file  Stress: Not on file  Social Connections: Not on file  Intimate Partner Violence: Not on file   Family History  Problem Relation Age of Onset   Heart failure Mother    Hypertension Mother    Gout Mother    Hypothyroidism Mother    Heart Problems Sister    Breast cancer Sister    Heart Problems Brother    Current Outpatient Medications on File Prior to Visit  Medication Sig   ferrous sulfate 325 (65 FE) MG tablet Take  325 mg by mouth daily with breakfast.   fluticasone (FLONASE) 50 MCG/ACT nasal spray SHAKE LIQUID AND USE 2 SPRAYS IN EACH NOSTRIL DAILY   traZODone (DESYREL) 50 MG tablet Take 1 tablet (50 mg total) by mouth at bedtime.   No current facility-administered medications on file prior to visit.    Review of Systems  Constitutional:  Negative for activity change, appetite change, chills, diaphoresis, fatigue and fever.  HENT:  Negative for congestion and hearing loss.   Eyes:  Negative for visual disturbance.  Respiratory:  Negative for cough, chest tightness, shortness of breath and wheezing.   Cardiovascular:  Negative for chest pain, palpitations and leg swelling.  Gastrointestinal:  Negative for abdominal pain, constipation, diarrhea, nausea and vomiting.  Genitourinary:  Negative for dysuria, frequency and hematuria.  Musculoskeletal:  Negative for arthralgias and neck pain.  Skin:  Negative for rash.  Neurological:  Negative for dizziness, weakness, light-headedness, numbness and headaches.  Hematological:  Negative for adenopathy.  Psychiatric/Behavioral:  Negative for behavioral problems, dysphoric mood and sleep disturbance.   Per HPI unless specifically indicated above      Objective:    BP 115/62   Pulse (!) 57   Ht  (1.727 m)   Wt 168 lb 3.2 oz (76.3 kg)   SpO2 99%   BMI 25.57 kg/m   Wt Readings from Last 3 Encounters:  12/05/20 168 lb 3.2 oz (76.3 kg)  06/13/20 176 lb 9.6 oz (80.1 kg)  01/24/20 182 lb 9.6 oz (82.8 kg)    Physical Exam Vitals and nursing note reviewed.  Constitutional:      General: She is not in acute distress.    Appearance: She is well-developed. She is not diaphoretic.     Comments: Well-appearing, comfortable, cooperative  HENT:     Head: Normocephalic and atraumatic.  Eyes:     General:        Right eye: No discharge.        Left eye: No discharge.     Conjunctiva/sclera: Conjunctivae normal.     Pupils: Pupils are equal, round,  and reactive to light.  Neck:     Thyroid: No thyromegaly.  Cardiovascular:     Rate and Rhythm: Normal rate and regular rhythm.     Pulses: Normal pulses.     Heart sounds: Normal heart sounds. No murmur heard. Pulmonary:     Effort: Pulmonary effort is normal. No respiratory distress.     Breath sounds: Normal breath sounds. No wheezing or rales.  Abdominal:     General: Bowel sounds are normal. There is no distension.  Palpations: Abdomen is soft. There is no mass.     Tenderness: There is no abdominal tenderness.  Musculoskeletal:        General: No tenderness. Normal range of motion.     Cervical back: Normal range of motion and neck supple.     Right lower leg: No edema.     Left lower leg: No edema.     Comments: Upper / Lower Extremities: - Normal muscle tone, strength bilateral upper extremities 5/5, lower extremities 5/5  Lymphadenopathy:     Cervical: No cervical adenopathy.  Skin:    General: Skin is warm and dry.     Findings: No erythema or rash.  Neurological:     Mental Status: She is alert and oriented to person, place, and time.     Comments: Distal sensation intact to light touch all extremities  Psychiatric:        Mood and Affect: Mood normal.        Behavior: Behavior normal.        Thought Content: Thought content normal.     Comments: Well groomed, good eye contact, normal speech and thoughts   I have personally reviewed the radiology report from 06/16/20  US BREAST LTD UNI LEFT INC AXILLA [[id number]] Resulted: 06/16/20 1031  Order Status: Completed Updated: 06/16/20 1232  Narrative:    CLINICAL DATA:  Patient recalled from screening for left breast  mass.   EXAM:  DIGITAL DIAGNOSTIC UNILATERAL LEFT MAMMOGRAM WITH TOMOSYNTHESIS AND  CAD; ULTRASOUND LEFT BREAST LIMITED   TECHNIQUE:  Left digital diagnostic mammography and breast tomosynthesis was  performed. The images were evaluated with computer-aided detection.;  Targeted ultrasound  examination of the left breast was performed   COMPARISON:  Previous exam(s).   ACR Breast Density Category b: There are scattered areas of  fibroglandular density.   FINDINGS:  Within the anterior third of the left breast inferiorly there is a  persistent low-density lobular mass further evaluated with spot  compression views.   Targeted ultrasound is performed, showing a 9 x 3 x 4 mm cluster of  cysts left breast 7 o'clock position 2 cm from nipple.   IMPRESSION:  Left breast cluster of cysts.   No mammographic evidence for malignancy.   RECOMMENDATION:  Screening mammogram in one year.(Code:[id number]-B-01Y)   I have discussed the findings and recommendations with the patient.  If applicable, a reminder letter will be sent to the patient  regarding the next appointment.   BI-RADS CATEGORY  2: Benign.    Electronically Signed    By: Annia Belt M.D.    On: 06/16/2020 10:31      Results for orders placed or performed in visit on 01/24/20  Hemoglobin A1c  Result Value Ref Range   Hgb A1c MFr Bld 5.9 (H) <5.7 % of total Hgb   Mean Plasma Glucose 123 (calc)   eAG (mmol/L) 6.8 (calc)  CBC with Differential/Platelet  Result Value Ref Range   WBC 5.4 3.8 - 10.8 Thousand/uL   RBC 6.24 (H) 3.80 - 5.10 Million/uL   Hemoglobin 13.5 11.7 - 15.5 g/dL   HCT 54.6 50.3 - 54.6 %   MCV 72.1 (L) 80.0 - 100.0 fL   MCH 21.6 (L) 27.0 - 33.0 pg   MCHC 30.0 (L) 32.0 - 36.0 g/dL   RDW 56.8 (H) 12.7 - 51.7 %   Platelets 191 140 - 400 Thousand/uL   MPV 11.1 7.5 - 12.5 fL   Neutro Abs 2,992 1,500 -  7,800 cells/uL   Lymphs Abs 1,609 850 - 3,900 cells/uL   Absolute Monocytes 686 200 - 950 cells/uL   Eosinophils Absolute 81 15 - 500 cells/uL   Basophils Absolute 32 0 - 200 cells/uL   Neutrophils Relative % 55.4 %   Total Lymphocyte 29.8 %   Monocytes Relative 12.7 %   Eosinophils Relative 1.5 %   Basophils Relative 0.6 %  COMPLETE METABOLIC PANEL WITH GFR  Result Value Ref Range    Glucose, Bld 73 65 - 99 mg/dL   BUN 14 7 - 25 mg/dL   Creat 9.70 2.63 - 7.85 mg/dL   GFR, Est Non African American 71 > OR = 60 mL/min/1.52m2   GFR, Est African American 82 > OR = 60 mL/min/1.23m2   BUN/Creatinine Ratio NOT APPLICABLE 6 - 22 (calc)   Sodium 139 135 - 146 mmol/L   Potassium 3.6 3.5 - 5.3 mmol/L   Chloride 100 98 - 110 mmol/L   CO2 27 20 - 32 mmol/L   Calcium 10.1 8.6 - 10.4 mg/dL   Total Protein 7.5 6.1 - 8.1 g/dL   Albumin 4.5 3.6 - 5.1 g/dL   Globulin 3.0 1.9 - 3.7 g/dL (calc)   AG Ratio 1.5 1.0 - 2.5 (calc)   Total Bilirubin 0.8 0.2 - 1.2 mg/dL   Alkaline phosphatase (APISO) 87 37 - 153 U/L   AST 17 10 - 35 U/L   ALT 13 6 - 29 U/L  Lipid panel  Result Value Ref Range   Cholesterol 178 <200 mg/dL   HDL 54 > OR = 50 mg/dL   Triglycerides 84 <885 mg/dL   LDL Cholesterol (Calc) 107 (H) mg/dL (calc)   Total CHOL/HDL Ratio 3.3 <5.0 (calc)   Non-HDL Cholesterol (Calc) 124 <130 mg/dL (calc)  Vitamin O27  Result Value Ref Range   Vitamin B-12 362 200 - 1,100 pg/mL  TSH  Result Value Ref Range   TSH 1.60 0.40 - 4.50 mIU/L  T4, free  Result Value Ref Range   Free T4 1.2 0.8 - 1.8 ng/dL      Assessment & Plan:   Problem List Items Addressed This Visit     Psychophysiological insomnia   Relevant Medications   sertraline (ZOLOFT) 50 MG tablet   Major depressive disorder, recurrent, moderate (HCC)   Relevant Medications   sertraline (ZOLOFT) 50 MG tablet   Hypothyroidism   Relevant Medications   levothyroxine (SYNTHROID) 25 MCG tablet   metoprolol succinate (TOPROL-XL) 25 MG 24 hr tablet   GAD (generalized anxiety disorder)   Relevant Medications   sertraline (ZOLOFT) 50 MG tablet   Elevated hemoglobin A1c   Dyslipidemia   Relevant Medications   atorvastatin (LIPITOR) 20 MG tablet   Benign essential HTN   Relevant Medications   amLODipine (NORVASC) 5 MG tablet   hydrochlorothiazide (HYDRODIURIL) 25 MG tablet   metoprolol succinate (TOPROL-XL) 25 MG  24 hr tablet   atorvastatin (LIPITOR) 20 MG tablet   Other Visit Diagnoses     Annual physical exam    -  Primary       Updated Health Maintenance information Due for Flu Shot when available Return for fasting lab 1 week. Encouraged improvement to lifestyle with diet and exercise Goal of weight loss, maintain  No orders of the defined types were placed in this encounter.   Meds ordered this encounter  Medications   amLODipine (NORVASC) 5 MG tablet    Sig: TAKE 1 TABLET(5 MG) BY MOUTH DAILY  Dispense:  90 tablet    Refill:  3   hydrochlorothiazide (HYDRODIURIL) 25 MG tablet    Sig: Take 1 tablet (25 mg total) by mouth daily.    Dispense:  90 tablet    Refill:  3   sertraline (ZOLOFT) 50 MG tablet    Sig: Take 1 tablet (50 mg total) by mouth daily.    Dispense:  90 tablet    Refill:  3   levothyroxine (SYNTHROID) 25 MCG tablet    Sig: Take 1 tablet (25 mcg total) by mouth daily before breakfast.    Dispense:  90 tablet    Refill:  3   metoprolol succinate (TOPROL-XL) 25 MG 24 hr tablet    Sig: Take 1 tablet (25 mg total) by mouth daily.    Dispense:  90 tablet    Refill:  3   atorvastatin (LIPITOR) 20 MG tablet    Sig: TAKE 1 TABLET(20 MG) BY MOUTH AT BEDTIME    Dispense:  90 tablet    Refill:  3     Follow up plan: Return in about 1 week (around 12/12/2020) for 1 week fasting lab only Friday AM / next f/u 6 months Feb 2023.  Saralyn Pilar, DO [facility] 12/05/2020, 1:54 PM

## 2020-12-05 NOTE — Patient Instructions (Addendum)
Thank you for coming to the office today.  Excellent work with the weight loss!  Filed Weights   12/05/20 1347  Weight: 168 lb 3.2 oz (76.3 kg)      DUE for FASTING BLOOD WORK (no food or drink after midnight before the lab appointment, only water or coffee without cream/sugar on the morning of)  SCHEDULE "Lab Only" visit in the morning at the clinic for lab draw in 1 WEEK  - Make sure Lab Only appointment is at about 1 week before your next appointment, so that results will be available  For Lab Results, once available within 2-3 days of blood draw, you can can log in to MyChart online to view your results and a brief explanation. Also, we can discuss results at next follow-up visit.   Please schedule a Follow-up Appointment to: Return in about 1 week (around 12/12/2020) for 1 week fasting lab only Friday AM / next f/u 6 months Feb 2023.  If you have any other questions or concerns, please feel free to call the office or send a message through MyChart. You may also schedule an earlier appointment if necessary.  Additionally, you may be receiving a survey about your experience at our office within a few days to 1 week by e-mail or mail. We value your feedback.  Saralyn Pilar, DO [facility], [place]

## 2020-12-12 ENCOUNTER — Encounter: Payer: Medicare Other | Admitting: Family Medicine

## 2021-02-10 ENCOUNTER — Ambulatory Visit (INDEPENDENT_AMBULATORY_CARE_PROVIDER_SITE_OTHER): Payer: Medicare Other

## 2021-02-10 VITALS — Ht 68.0 in | Wt 180.0 lb

## 2021-02-10 DIAGNOSIS — Z0000 Encounter for general adult medical examination without abnormal findings: Secondary | ICD-10-CM

## 2021-02-10 NOTE — Patient Instructions (Signed)
Sharon Craig , Thank you for taking time to come for your Medicare Wellness Visit. I appreciate your ongoing commitment to your health goals. Please review the following plan we discussed and let me know if I can assist you in the future.   Screening recommendations/referrals: Colonoscopy: completed 12/05/2014 Mammogram: completed 05/14/2020 Bone Density: completed 12/12/2014 Recommended yearly ophthalmology/optometry visit for glaucoma screening and checkup Recommended yearly dental visit for hygiene and checkup  Vaccinations: Influenza vaccine: due Pneumococcal vaccine: due Tdap vaccine: due Shingles vaccine: discussed   Covid-19: 12/02/2019, 11/04/2019  Advanced directives: Advance directive discussed with you today. .  Conditions/risks identified: none  Next appointment: Follow up in one year for your annual wellness visit    Preventive Care 65 Years and Older, Female Preventive care refers to lifestyle choices and visits with your health care provider that can promote health and wellness. What does preventive care include? A yearly physical exam. This is also called an annual well check. Dental exams once or twice a year. Routine eye exams. Ask your health care provider how often you should have your eyes checked. Personal lifestyle choices, including: Daily care of your teeth and gums. Regular physical activity. Eating a healthy diet. Avoiding tobacco and drug use. Limiting alcohol use. Practicing safe sex. Taking low-dose aspirin every day. Taking vitamin and mineral supplements as recommended by your health care provider. What happens during an annual well check? The services and screenings done by your health care provider during your annual well check will depend on your age, overall health, lifestyle risk factors, and family history of disease. Counseling  Your health care provider may ask you questions about your: Alcohol use. Tobacco use. Drug use. Emotional  well-being. Home and relationship well-being. Sexual activity. Eating habits. History of falls. Memory and ability to understand (cognition). Work and work Astronomer. Reproductive health. Screening  You may have the following tests or measurements: Height, weight, and BMI. Blood pressure. Lipid and cholesterol levels. These may be checked every 5 years, or more frequently if you are over 40 years old. Skin check. Lung cancer screening. You may have this screening every year starting at age 48 if you have a 30-pack-year history of smoking and currently smoke or have quit within the past 15 years. Fecal occult blood test (FOBT) of the stool. You may have this test every year starting at age 42. Flexible sigmoidoscopy or colonoscopy. You may have a sigmoidoscopy every 5 years or a colonoscopy every 10 years starting at age 9. Hepatitis C blood test. Hepatitis B blood test. Sexually transmitted disease (STD) testing. Diabetes screening. This is done by checking your blood sugar (glucose) after you have not eaten for a while (fasting). You may have this done every 1-3 years. Bone density scan. This is done to screen for osteoporosis. You may have this done starting at age 80. Mammogram. This may be done every 1-2 years. Talk to your health care provider about how often you should have regular mammograms. Talk with your health care provider about your test results, treatment options, and if necessary, the need for more tests. Vaccines  Your health care provider may recommend certain vaccines, such as: Influenza vaccine. This is recommended every year. Tetanus, diphtheria, and acellular pertussis (Tdap, Td) vaccine. You may need a Td booster every 10 years. Zoster vaccine. You may need this after age 31. Pneumococcal 13-valent conjugate (PCV13) vaccine. One dose is recommended after age 67. Pneumococcal polysaccharide (PPSV23) vaccine. One dose is recommended after age 40. Talk  to your  health care provider about which screenings and vaccines you need and how often you need them. This information is not intended to replace advice given to you by your health care provider. Make sure you discuss any questions you have with your health care provider. Document Released: 05/02/2015 Document Revised: 12/24/2015 Document Reviewed: 02/04/2015 Elsevier Interactive Patient Education  2017 [place] Prevention in the Home Falls can cause injuries. They can happen to people of all ages. There are many things you can do to make your home safe and to help prevent falls. What can I do on the outside of my home? Regularly fix the edges of walkways and driveways and fix any cracks. Remove anything that might make you trip as you walk through a door, such as a raised step or threshold. Trim any bushes or trees on the path to your home. Use bright outdoor lighting. Clear any walking paths of anything that might make someone trip, such as rocks or tools. Regularly check to see if handrails are loose or broken. Make sure that both sides of any steps have handrails. Any raised decks and porches should have guardrails on the edges. Have any leaves, snow, or ice cleared regularly. Use sand or salt on walking paths during winter. Clean up any spills in your garage right away. This includes oil or grease spills. What can I do in the bathroom? Use night lights. Install grab bars by the toilet and in the tub and shower. Do not use towel bars as grab bars. Use non-skid mats or decals in the tub or shower. If you need to sit down in the shower, use a plastic, non-slip stool. Keep the floor dry. Clean up any water that spills on the floor as soon as it happens. Remove soap buildup in the tub or shower regularly. Attach bath mats securely with double-sided non-slip rug tape. Do not have throw rugs and other things on the floor that can make you trip. What can I do in the bedroom? Use night  lights. Make sure that you have a light by your bed that is easy to reach. Do not use any sheets or blankets that are too big for your bed. They should not hang down onto the floor. Have a firm chair that has side arms. You can use this for support while you get dressed. Do not have throw rugs and other things on the floor that can make you trip. What can I do in the kitchen? Clean up any spills right away. Avoid walking on wet floors. Keep items that you use a lot in easy-to-reach places. If you need to reach something above you, use a strong step stool that has a grab bar. Keep electrical cords out of the way. Do not use floor polish or wax that makes floors slippery. If you must use wax, use non-skid floor wax. Do not have throw rugs and other things on the floor that can make you trip. What can I do with my stairs? Do not leave any items on the stairs. Make sure that there are handrails on both sides of the stairs and use them. Fix handrails that are broken or loose. Make sure that handrails are as long as the stairways. Check any carpeting to make sure that it is firmly attached to the stairs. Fix any carpet that is loose or worn. Avoid having throw rugs at the top or bottom of the stairs. If you do have throw rugs,  attach them to the floor with carpet tape. Make sure that you have a light switch at the top of the stairs and the bottom of the stairs. If you do not have them, ask someone to add them for you. What else can I do to help prevent falls? Wear shoes that: Do not have high heels. Have rubber bottoms. Are comfortable and fit you well. Are closed at the toe. Do not wear sandals. If you use a stepladder: Make sure that it is fully opened. Do not climb a closed stepladder. Make sure that both sides of the stepladder are locked into place. Ask someone to hold it for you, if possible. Clearly mark and make sure that you can see: Any grab bars or handrails. First and last  steps. Where the edge of each step is. Use tools that help you move around (mobility aids) if they are needed. These include: Canes. Walkers. Scooters. Crutches. Turn on the lights when you go into a dark area. Replace any light bulbs as soon as they burn out. Set up your furniture so you have a clear path. Avoid moving your furniture around. If any of your floors are uneven, fix them. If there are any pets around you, be aware of where they are. Review your medicines with your doctor. Some medicines can make you feel dizzy. This can increase your chance of falling. Ask your doctor what other things that you can do to help prevent falls. This information is not intended to replace advice given to you by your health care provider. Make sure you discuss any questions you have with your health care provider. Document Released: 01/30/2009 Document Revised: 09/11/2015 Document Reviewed: 05/10/2014 Elsevier Interactive Patient Education  2017 [employer].

## 2021-02-10 NOTE — Progress Notes (Signed)
I connected with Sharon Craig today by telephone and verified that I am speaking with the correct person using two identifiers. Location patient: home Location provider: work Persons participating in the virtual visit: Carleene Overlie, Elisha Ponder LPN.   I discussed the limitations, risks, security and privacy concerns of performing an evaluation and management service by telephone and the availability of in person appointments. I also discussed with the patient that there may be a patient responsible charge related to this service. The patient expressed understanding and verbally consented to this telephonic visit.    Interactive audio and video telecommunications were attempted between this provider and patient, however failed, due to patient having technical difficulties OR patient did not have access to video capability.  We continued and completed visit with audio only.     Vital signs may be patient reported or missing.  Subjective:   Sharon Craig is a 70 y.o. female who presents for Medicare Annual (Subsequent) preventive examination.  Review of Systems     Cardiac Risk Factors include: advanced age (>10men, >55 women);dyslipidemia;hypertension;sedentary lifestyle     Objective:    Today's Vitals   02/10/21 0816  Weight: 180 lb (81.6 kg)  Height: 5\' 8"  (1.727 m)   Body mass index is 27.37 kg/m.  Advanced Directives 02/10/2021 01/23/2019 01/23/2019 01/17/2018 12/21/2016  Does Patient Have a Medical Advance Directive? No No No No No  Would patient like information on creating a medical advance directive? - - - Yes (MAU/Ambulatory/Procedural Areas - Information given) Yes (MAU/Ambulatory/Procedural Areas - Information given)    Current Medications (verified) Outpatient Encounter Medications as of 02/10/2021  Medication Sig   amLODipine (NORVASC) 5 MG tablet TAKE 1 TABLET(5 MG) BY MOUTH DAILY   atorvastatin (LIPITOR) 20 MG tablet TAKE 1 TABLET(20 MG) BY MOUTH  AT BEDTIME   fluticasone (FLONASE) 50 MCG/ACT nasal spray SHAKE LIQUID AND USE 2 SPRAYS IN EACH NOSTRIL DAILY   hydrochlorothiazide (HYDRODIURIL) 25 MG tablet Take 1 tablet (25 mg total) by mouth daily.   levothyroxine (SYNTHROID) 25 MCG tablet Take 1 tablet (25 mcg total) by mouth daily before breakfast.   metoprolol succinate (TOPROL-XL) 25 MG 24 hr tablet Take 1 tablet (25 mg total) by mouth daily.   traZODone (DESYREL) 50 MG tablet Take 1 tablet (50 mg total) by mouth at bedtime.   ferrous sulfate 325 (65 FE) MG tablet Take 325 mg by mouth daily with breakfast. (Patient not taking: Reported on 02/10/2021)   sertraline (ZOLOFT) 50 MG tablet Take 1 tablet (50 mg total) by mouth daily. (Patient not taking: Reported on 02/10/2021)   No facility-administered encounter medications on file as of 02/10/2021.    Allergies (verified) Hydrocodone   History: Past Medical History:  Diagnosis Date   Hyperlipidemia    Hypothyroidism    Past Surgical History:  Procedure Laterality Date   ABDOMINAL HYSTERECTOMY     COLONOSCOPY WITH PROPOFOL N/A 12/05/2014   Procedure: COLONOSCOPY WITH PROPOFOL;  Surgeon: 12/07/2014, MD;  Location: [facility] ENDOSCOPY;  Service: Gastroenterology;  Laterality: N/A;   KNEE SURGERY Left    TUBAL LIGATION     Family History  Problem Relation Age of Onset   Heart failure Mother    Hypertension Mother    Gout Mother    Hypothyroidism Mother    Heart Problems Sister    Breast cancer Sister    Heart Problems Brother    Social History   Socioeconomic History   Marital status: Married    Spouse  name: Not on file   Number of children: Not on file   Years of education: Not on file   Highest education level: 12th grade  Occupational History   Not on file  Tobacco Use   Smoking status: Never   Smokeless tobacco: Never  Vaping Use   Vaping Use: Never used  Substance and Sexual Activity   Alcohol use: No   Drug use: No   Sexual activity: Not Currently  Other  Topics Concern   Not on file  Social History Narrative   Not on file   Social Determinants of Health   Financial Resource Strain: Low Risk    Difficulty of Paying Living Expenses: Not hard at all  Food Insecurity: No Food Insecurity   Worried About Programme researcher, broadcasting/film/video in the Last Year: Never true   Ran Out of Food in the Last Year: Never true  Transportation Needs: No Transportation Needs   Lack of Transportation (Medical): No   Lack of Transportation (Non-Medical): No  Physical Activity: Inactive   Days of Exercise per Week: 0 days   Minutes of Exercise per Session: 0 min  Stress: Stress Concern Present   Feeling of Stress : To some extent  Social Connections: Not on file    Tobacco Counseling Counseling given: Not Answered   Clinical Intake:  Pre-visit preparation completed: Yes  Pain : No/denies pain     Nutritional Status: BMI 25 -29 Overweight Nutritional Risks: None Diabetes: No  How often do you need to have someone help you when you read instructions, pamphlets, or other written materials from your doctor or pharmacy?: 1 - Never What is the last grade level you completed in school?: 12th grade  Diabetic? no  Interpreter Needed?: No  Information entered by :: NAllen LPN   Activities of Daily Living In your present state of health, do you have any difficulty performing the following activities: 02/10/2021  Hearing? N  Vision? N  Difficulty concentrating or making decisions? N  Walking or climbing stairs? N  Dressing or bathing? N  Doing errands, shopping? Y  Comment does not have Copy and eating ? N  Using the Toilet? N  In the past six months, have you accidently leaked urine? N  Do you have problems with loss of bowel control? N  Managing your Medications? N  Managing your Finances? N  Housekeeping or managing your Housekeeping? N  Some recent data might be hidden    Patient Care Team: Smitty Cords, DO as PCP -  General (Family Medicine) Ronney Asters, Jackelyn Poling, RPH-CPP as Pharmacist  Indicate any recent Medical Services you may have received from other than [facility] providers in the past year (date may be approximate).     Assessment:   This is a routine wellness examination for Sharon Craig.  Hearing/Vision screen Vision Screening - Comments:: Regular eye exams, [facility]  Dietary issues and exercise activities discussed: Current Exercise Habits: The patient does not participate in regular exercise at present   Goals Addressed             This Visit's Progress    Patient Stated       02/10/2021, no goals       Depression Screen PHQ 2/9 Scores 02/10/2021 12/05/2020 06/13/2020 01/24/2020 12/14/2019 08/10/2019 02/09/2019  PHQ - 2 Score 0 2 5 4 3  0 0  PHQ- 9 Score - 2 5 8 8  - -    Fall Risk Fall Risk  02/10/2021 08/10/2019 02/09/2019 01/23/2019 01/17/2018  Falls in the past year? 0 0 0 0 No  Number falls in past yr: - 0 - 0 -  Injury with Fall? - 0 - 0 -  Risk for fall due to : Medication side effect - - - -  Follow up Falls evaluation completed;Education provided;Falls prevention discussed Falls evaluation completed Falls evaluation completed - -    FALL RISK PREVENTION PERTAINING TO THE HOME:  Any stairs in or around the home? No  If so, are there any without handrails? N/a Home free of loose throw rugs in walkways, pet beds, electrical cords, etc? Yes  Adequate lighting in your home to reduce risk of falls? Yes   ASSISTIVE DEVICES UTILIZED TO PREVENT FALLS:  Life alert? No  Use of a cane, walker or w/c? No  Grab bars in the bathroom? Yes  Shower chair or bench in shower? No  Elevated toilet seat or a handicapped toilet? No   TIMED UP AND GO:  Was the test performed? No .      Cognitive Function:     6CIT Screen 02/10/2021 12/14/2019 01/17/2018 12/21/2016  What Year? 0 points 0 points 0 points 0 points  What month? 0 points 0 points 0 points 0 points  What time? 0 points 0  points 0 points 0 points  Count back from 20 0 points 0 points 0 points 0 points  Months in reverse 2 points 0 points 0 points 0 points  Repeat phrase 0 points 2 points 2 points 0 points  Total Score 2 2 2  0    Immunizations Immunization History  Administered Date(s) Administered   Fluad Quad(high Dose 65+) 02/09/2019, 01/24/2020   Influenza, High Dose Seasonal PF 03/17/2015, 12/21/2016, 04/27/2018   Moderna Sars-Covid-2 Vaccination 11/04/2019, 12/02/2019   Pneumococcal Polysaccharide-23 12/21/2016    TDAP status: Due, Education has been provided regarding the importance of this vaccine. Advised may receive this vaccine at local pharmacy or Health Dept. Aware to provide a copy of the vaccination record if obtained from local pharmacy or Health Dept. Verbalized acceptance and understanding.  Flu Vaccine status: Due, Education has been provided regarding the importance of this vaccine. Advised may receive this vaccine at local pharmacy or Health Dept. Aware to provide a copy of the vaccination record if obtained from local pharmacy or Health Dept. Verbalized acceptance and understanding.  Pneumococcal vaccine status: Due, Education has been provided regarding the importance of this vaccine. Advised may receive this vaccine at local pharmacy or Health Dept. Aware to provide a copy of the vaccination record if obtained from local pharmacy or Health Dept. Verbalized acceptance and understanding.  Covid-19 vaccine status: Completed vaccines  Qualifies for Shingles Vaccine? Yes   Zostavax completed No   Shingrix Completed?: No.    Education has been provided regarding the importance of this vaccine. Patient has been advised to call insurance company to determine out of pocket expense if they have not yet received this vaccine. Advised may also receive vaccine at local pharmacy or Health Dept. Verbalized acceptance and understanding.  Screening Tests Health Maintenance  Topic Date Due   Zoster  Vaccines- Shingrix (1 of 2) Never done   TETANUS/TDAP  08/17/2016   Pneumonia Vaccine 54+ Years old (2 - PCV) 12/21/2017   COVID-19 Vaccine (3 - Booster for Moderna series) 01/27/2020   INFLUENZA VACCINE  11/17/2020   MAMMOGRAM  05/14/2022   COLONOSCOPY (Pts 45-61yrs Insurance coverage will need to be confirmed)  12/04/2024  DEXA SCAN  Completed   Hepatitis C Screening  Completed   HPV VACCINES  Aged Out    Health Maintenance  Health Maintenance Due  Topic Date Due   Zoster Vaccines- Shingrix (1 of 2) Never done   TETANUS/TDAP  08/17/2016   Pneumonia Vaccine 104+ Years old (2 - PCV) 12/21/2017   COVID-19 Vaccine (3 - Booster for Moderna series) 01/27/2020   INFLUENZA VACCINE  11/17/2020    Colorectal cancer screening: Type of screening: Colonoscopy. Completed 12/05/2014. Repeat every 10 years  Mammogram status: Completed 05/14/2020. Repeat every year  Bone Density status: Completed 12/12/2014.   Lung Cancer Screening: (Low Dose CT Chest recommended if Age 60-80 years, 30 pack-year currently smoking OR have quit w/in 15years.) does not qualify.   Lung Cancer Screening Referral: no  Additional Screening:  Hepatitis C Screening: does qualify; Completed 12/21/2016  Vision Screening: Recommended annual ophthalmology exams for early detection of glaucoma and other disorders of the eye. Is the patient up to date with their annual eye exam?  Yes  Who is the provider or what is the name of the office in which the patient attends annual eye exams? [facility] If pt is not established with a provider, would they like to be referred to a provider to establish care? No .   Dental Screening: Recommended annual dental exams for proper oral hygiene  Community Resource Referral / Chronic Care Management: CRR required this visit?  No   CCM required this visit?  No      Plan:     I have personally reviewed and noted the following in the patient's chart:   Medical and social  history Use of alcohol, tobacco or illicit drugs  Current medications and supplements including opioid prescriptions.  Functional ability and status Nutritional status Physical activity Advanced directives List of other physicians Hospitalizations, surgeries, and ER visits in previous 12 months Vitals Screenings to include cognitive, depression, and falls Referrals and appointments  In addition, I have reviewed and discussed with patient certain preventive protocols, quality metrics, and best practice recommendations. A written personalized care plan for preventive services as well as general preventive health recommendations were provided to patient.     Barb Merino, LPN   [id number]/01/[id number]   Nurse Notes:

## 2021-04-02 ENCOUNTER — Other Ambulatory Visit: Payer: Self-pay | Admitting: Family Medicine

## 2021-04-03 NOTE — Telephone Encounter (Signed)
Requested Prescriptions  Pending Prescriptions Disp Refills   fluticasone (FLONASE) 50 MCG/ACT nasal spray [Pharmacy Med Name: FLUTICASONE NASAL SP (120) RX] 48 g 1    Sig: SHAKE LIQUID AND USE 2 SPRAYS IN EACH NOSTRIL DAILY     Ear, Nose, and Throat: Nasal Preparations - Corticosteroids Passed - 04/02/2021  3:36 PM      Passed - Valid encounter within last 12 months    Recent Outpatient Visits          3 months ago Annual physical exam   [facility] [place], Netta Neat, DO   9 months ago Major depressive disorder, recurrent, moderate (HCC)   [facility] Smitty Cords, DO   1 year ago Major depressive disorder, recurrent, moderate (HCC)   [facility] Smitty Cords, DO   1 year ago Major depressive disorder, recurrent, moderate (HCC)   [facility] Smitty Cords, DO   1 year ago Elevated hemoglobin A1c   [facility] Althea Charon, Netta Neat, DO      Future Appointments            In 6 days Althea Charon, Netta Neat, DO [facility]   In 10 months  [facility]

## 2021-04-09 ENCOUNTER — Encounter: Payer: Self-pay | Admitting: Family Medicine

## 2021-04-09 ENCOUNTER — Other Ambulatory Visit: Payer: Self-pay

## 2021-04-09 ENCOUNTER — Ambulatory Visit (INDEPENDENT_AMBULATORY_CARE_PROVIDER_SITE_OTHER): Payer: Medicare Other | Admitting: Family Medicine

## 2021-04-09 ENCOUNTER — Other Ambulatory Visit: Payer: Self-pay | Admitting: Family Medicine

## 2021-04-09 VITALS — BP 108/63 | HR 63 | Ht 68.0 in | Wt 168.4 lb

## 2021-04-09 DIAGNOSIS — G3184 Mild cognitive impairment, so stated: Secondary | ICD-10-CM | POA: Diagnosis not present

## 2021-04-09 DIAGNOSIS — E039 Hypothyroidism, unspecified: Secondary | ICD-10-CM | POA: Diagnosis not present

## 2021-04-09 DIAGNOSIS — E785 Hyperlipidemia, unspecified: Secondary | ICD-10-CM | POA: Diagnosis not present

## 2021-04-09 DIAGNOSIS — F411 Generalized anxiety disorder: Secondary | ICD-10-CM

## 2021-04-09 DIAGNOSIS — E559 Vitamin D deficiency, unspecified: Secondary | ICD-10-CM | POA: Diagnosis not present

## 2021-04-09 DIAGNOSIS — F5104 Psychophysiologic insomnia: Secondary | ICD-10-CM

## 2021-04-09 DIAGNOSIS — L821 Other seborrheic keratosis: Secondary | ICD-10-CM

## 2021-04-09 DIAGNOSIS — R718 Other abnormality of red blood cells: Secondary | ICD-10-CM

## 2021-04-09 DIAGNOSIS — E538 Deficiency of other specified B group vitamins: Secondary | ICD-10-CM | POA: Diagnosis not present

## 2021-04-09 DIAGNOSIS — R7309 Other abnormal glucose: Secondary | ICD-10-CM | POA: Diagnosis not present

## 2021-04-09 NOTE — Patient Instructions (Addendum)
Thank you for coming to the office today.  [facility] - Neurology Dept [address] [place], [place] [address] Phone: [phone]  Referral sent - stay tuned for apt.  Labs today. We can look at iron to determine if need in future.  May need dermatologist in future, those spots are more skin spots / moles. Can be very normal darker spots with aging.  Mammogram in February   Please schedule a Follow-up Appointment to: Return if symptoms worsen or fail to improve.  If you have any other questions or concerns, please feel free to call the office or send a message through MyChart. You may also schedule an earlier appointment if necessary.  Additionally, you may be receiving a survey about your experience at our office within a few days to 1 week by e-mail or mail. We value your feedback.  Saralyn Pilar, DO [facility], [place]

## 2021-04-09 NOTE — Progress Notes (Signed)
Subjective:    Patient ID: Sharon Craig, female    DOB: [birth year], 70 y.o.   MRN: [id number]  Sharon Craig is a 70 y.o. 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  She has had gradual progressive decline over past 4 years since 2018 with forgetfulness and memory Admits insomnia not always sleeping well at night, can wake up. She asks about sleeping medication or adjusting her mood med. - Her memory is affecting her still She has had 6CIT scores since 2018. There is a family history of Dementia maternal aunt   Skin change Breast Nipple - bilateral Unsure when this change happened. Thinks 1 year. She has moles that are darker on skin now on both sides  Elevated A1c Due for lab, previous range 5-6   HYPERLIPIDEMIA: - Reports no concerns. Last lipid panel 01/2020, controlled  - Currently taking Atorvastatin 20mg , tolerating well without side effects or myalgias Due for labs today   Hypothyroidism History of low thyroid, on levothyroxine daily, doing well with it Last lab 01/2020 normal thyroid panel   CHRONIC HTN: Reports no concerns.  Current Meds - amlodipine 5mg  daily , HCTZ 25mg  daily, Metoprolol 25mg  XL daily   Reports good compliance, took meds today. Tolerating well, w/o complaints. Denies CP, dyspnea, HA, edema, dizziness / lightheadedness   Depression screen [facility] 2/9 02/10/2021 12/05/2020 06/13/2020  Decreased Interest 0 1 3  Down, Depressed, Hopeless 0 1 2  PHQ - 2 Score 0 2 5  Altered sleeping - 0 0  Tired, decreased energy - 0 0  Change in appetite - 0 0  Feeling bad or failure about yourself  - 0 0  Trouble concentrating - 0 0  Moving slowly or  fidgety/restless - 0 0  Suicidal thoughts - 0 0  PHQ-9 Score - 2 5  Difficult doing work/chores - Not difficult at all Not difficult at all    Social History   Tobacco Use   Smoking status: Never   Smokeless tobacco: Never  Vaping Use   Vaping Use: Never used  Substance Use Topics   Alcohol use: No   Drug use: No    Review of Systems Per HPI unless specifically indicated above     Objective:    BP 108/63    Pulse 63    Ht 5\' 8"  (1.727 m)    Wt 168 lb 6.4 oz (76.4 kg)    SpO2 98%    BMI 25.61 kg/m   Wt Readings from Last 3 Encounters:  04/09/21 168 lb 6.4 oz (76.4 kg)  02/10/21 180 lb (81.6 kg)  12/05/20 168 lb 3.2 oz (76.3 kg)    Physical Exam Vitals and nursing note reviewed.  Constitutional:      General: She is not in acute distress.    Appearance: Normal appearance. She is well-developed. She is not diaphoretic.     Comments: Well-appearing, comfortable, cooperative  HENT:     Head: Normocephalic and atraumatic.  Eyes:     General:        Right eye: No discharge.        Left eye: No discharge.     Conjunctiva/sclera: Conjunctivae normal.  Cardiovascular:  Rate and Rhythm: Normal rate.  Pulmonary:     Effort: Pulmonary effort is normal.  Chest:     Comments: Both breasts with multiple distinct pigmented lesions consistent with benign SKs. Breast exam bilateral, chaperoned by Randa Lynn CMA.  Skin:    General: Skin is warm and dry.     Findings: No erythema or rash.  Neurological:     Mental Status: She is alert and oriented to person, place, and time.  Psychiatric:        Mood and Affect: Mood normal.        Behavior: Behavior normal.        Thought Content: Thought content normal.     Comments: Well groomed, good eye contact, normal speech and thoughts. She does repeat same questions during visit.    6CIT Screen 02/10/2021 12/14/2019 01/17/2018 12/21/2016  What Year? 0 points 0 points 0 points 0 points  What month? 0 points 0 points 0 points 0  points  What time? 0 points 0 points 0 points 0 points  Count back from 20 0 points 0 points 0 points 0 points  Months in reverse 2 points 0 points 0 points 0 points  Repeat phrase 0 points 2 points 2 points 0 points  Total Score 0     US BREAST LTD UNI LEFT INC AXILLA [[id number]] Resulted: 06/16/20 1031  Order Status: Completed Updated: 06/16/20 1232  Narrative:    CLINICAL DATA:  Patient recalled from screening for left breast  mass.   EXAM:  DIGITAL DIAGNOSTIC UNILATERAL LEFT MAMMOGRAM WITH TOMOSYNTHESIS AND  CAD; ULTRASOUND LEFT BREAST LIMITED   TECHNIQUE:  Left digital diagnostic mammography and breast tomosynthesis was  performed. The images were evaluated with computer-aided detection.;  Targeted ultrasound examination of the left breast was performed   COMPARISON:  Previous exam(s).   ACR Breast Density Category b: There are scattered areas of  fibroglandular density.   FINDINGS:  Within the anterior third of the left breast inferiorly there is a  persistent low-density lobular mass further evaluated with spot  compression views.   Targeted ultrasound is performed, showing a 9 x 3 x 4 mm cluster of  cysts left breast 7 o'clock position 2 cm from nipple.   IMPRESSION:  Left breast cluster of cysts.   No mammographic evidence for malignancy.   RECOMMENDATION:  Screening mammogram in one year.(Code:SM-B-01Y)   I have discussed the findings and recommendations with the patient.  If applicable, a reminder letter will be sent to the patient  regarding the next appointment.   BI-RADS CATEGORY  2: Benign.    Electronically Signed    By: Annia Belt M.D.    On: 06/16/2020 10:31      Results for orders placed or performed in visit on 01/24/20  Hemoglobin A1c  Result Value Ref Range   Hgb A1c MFr Bld 5.9 (H) <5.7 % of total Hgb   Mean Plasma Glucose 123 (calc)   eAG (mmol/L) 6.8 (calc)  CBC with Differential/Platelet  Result Value Ref Range   WBC  5.4 3.8 - 10.8 Thousand/uL   RBC 6.24 (H) 3.80 - 5.10 Million/uL   Hemoglobin 13.5 11.7 - 15.5 g/dL   HCT 40.9 81.1 - 91.4 %   MCV 72.1 (L) 80.0 - 100.0 fL   MCH 21.6 (L) 27.0 - 33.0 pg   MCHC 30.0 (L) 32.0 - 36.0 g/dL   RDW 78.2 (H) 95.6 - 21.3 %   Platelets 191 140 -  400 Thousand/uL   MPV 11.1 7.5 - 12.5 fL   Neutro Abs 2,992 1,500 - 7,800 cells/uL   Lymphs Abs 1,609 850 - 3,900 cells/uL   Absolute Monocytes 686 200 - 950 cells/uL   Eosinophils Absolute 81 15 - 500 cells/uL   Basophils Absolute 32 0 - 200 cells/uL   Neutrophils Relative % 55.4 %   Total Lymphocyte 29.8 %   Monocytes Relative 12.7 %   Eosinophils Relative 1.5 %   Basophils Relative 0.6 %  COMPLETE METABOLIC PANEL WITH GFR  Result Value Ref Range   Glucose, Bld 73 65 - 99 mg/dL   BUN 14 7 - 25 mg/dL   Creat 3.29 9.24 - 2.68 mg/dL   GFR, Est Non African American 71 > OR = 60 mL/min/1.20m2   GFR, Est African American 82 > OR = 60 mL/min/1.52m2   BUN/Creatinine Ratio NOT APPLICABLE 6 - 22 (calc)   Sodium 139 135 - 146 mmol/L   Potassium 3.6 3.5 - 5.3 mmol/L   Chloride 100 98 - 110 mmol/L   CO2 27 20 - 32 mmol/L   Calcium 10.1 8.6 - 10.4 mg/dL   Total Protein 7.5 6.1 - 8.1 g/dL   Albumin 4.5 3.6 - 5.1 g/dL   Globulin 3.0 1.9 - 3.7 g/dL (calc)   AG Ratio 1.5 1.0 - 2.5 (calc)   Total Bilirubin 0.8 0.2 - 1.2 mg/dL   Alkaline phosphatase (APISO) 87 37 - 153 U/L   AST 17 10 - 35 U/L   ALT 13 6 - 29 U/L  Lipid panel  Result Value Ref Range   Cholesterol 178 <200 mg/dL   HDL 54 > OR = 50 mg/dL   Triglycerides 84 <341 mg/dL   LDL Cholesterol (Calc) 107 (H) mg/dL (calc)   Total CHOL/HDL Ratio 3.3 <5.0 (calc)   Non-HDL Cholesterol (Calc) 124 <130 mg/dL (calc)  Vitamin D62  Result Value Ref Range   Vitamin B-12 362 200 - 1,100 pg/mL  TSH  Result Value Ref Range   TSH 1.60 0.40 - 4.50 mIU/L  T4, free  Result Value Ref Range   Free T4 1.2 0.8 - 1.8 ng/dL      Assessment & Plan:   Problem List Items  Addressed This Visit   None Visit Diagnoses     Mild cognitive impairment with memory loss    -  Primary   Relevant Orders   Ambulatory referral to Neurology   SK (seborrheic keratosis)           gradual decline memory function since 2018, has had 6CIT screening have been negative, but has had more functional symptoms with memory loss and forgetfulness. No significant behavioral episodes. She has had lab testing and screening and requesting now further neurological screening evaluation for dementia, has positive family history for dementia.   Labs today for chronic conditions and also include repeat dementia screening labs.  May need reconsider oral iron in future if anemic.  SKs on breast - normal appearance, can follow up with Dermatology for skin surveillance in future. But now keep current Mammogram plans for Feb 2023. Not due for sooner mammo or diagnostic based on this finding, it is unrelated to breast.  Orders Placed This Encounter  Procedures   Ambulatory referral to Neurology    Referral Priority:   Routine    Referral Type:   Consultation    Referral Reason:   Specialty Services Required    Requested Specialty:   Neurology  Number of Visits Requested:   1     No orders of the defined types were placed in this encounter.     Follow up plan: Return if symptoms worsen or fail to improve.  Future labs ordered for today, she is 4 months late on labs.  Saralyn Pilar, DO [facility] 04/09/2021, 11:34 AM

## 2021-04-10 LAB — CBC WITH DIFFERENTIAL/PLATELET
Absolute Monocytes: 514 cells/uL (ref 200–950)
Basophils Absolute: 38 cells/uL (ref 0–200)
Basophils Relative: 0.8 %
Eosinophils Absolute: 72 cells/uL (ref 15–500)
Eosinophils Relative: 1.5 %
HCT: 39.2 % (ref 35.0–45.0)
Hemoglobin: 12 g/dL (ref 11.7–15.5)
Lymphs Abs: 1646 cells/uL (ref 850–3900)
MCH: 22 pg — ABNORMAL LOW (ref 27.0–33.0)
MCHC: 30.6 g/dL — ABNORMAL LOW (ref 32.0–36.0)
MCV: 71.9 fL — ABNORMAL LOW (ref 80.0–100.0)
MPV: 12.7 fL — ABNORMAL HIGH (ref 7.5–12.5)
Monocytes Relative: 10.7 %
Neutro Abs: 2530 cells/uL (ref 1500–7800)
Neutrophils Relative %: 52.7 %
Platelets: 170 10*3/uL (ref 140–400)
RBC: 5.45 10*6/uL — ABNORMAL HIGH (ref 3.80–5.10)
RDW: 15.2 % — ABNORMAL HIGH (ref 11.0–15.0)
Total Lymphocyte: 34.3 %
WBC: 4.8 10*3/uL (ref 3.8–10.8)

## 2021-04-10 LAB — COMPLETE METABOLIC PANEL WITH GFR
AG Ratio: 1.5 (calc) (ref 1.0–2.5)
ALT: 11 U/L (ref 6–29)
AST: 16 U/L (ref 10–35)
Albumin: 3.9 g/dL (ref 3.6–5.1)
Alkaline phosphatase (APISO): 78 U/L (ref 37–153)
BUN: 15 mg/dL (ref 7–25)
CO2: 30 mmol/L (ref 20–32)
Calcium: 9.2 mg/dL (ref 8.6–10.4)
Chloride: 102 mmol/L (ref 98–110)
Creat: 0.63 mg/dL (ref 0.60–1.00)
Globulin: 2.6 g/dL (calc) (ref 1.9–3.7)
Glucose, Bld: 107 mg/dL — ABNORMAL HIGH (ref 65–99)
Potassium: 3.3 mmol/L — ABNORMAL LOW (ref 3.5–5.3)
Sodium: 140 mmol/L (ref 135–146)
Total Bilirubin: 0.8 mg/dL (ref 0.2–1.2)
Total Protein: 6.5 g/dL (ref 6.1–8.1)
eGFR: 94 mL/min/{1.73_m2} (ref >=60)

## 2021-04-10 LAB — TSH: TSH: 1.76 mIU/L (ref 0.40–4.50)

## 2021-04-10 LAB — HEMOGLOBIN A1C
Hgb A1c MFr Bld: 5.6 % of total Hgb (ref <5.7)
Mean Plasma Glucose: 114 mg/dL
eAG (mmol/L): 6.3 mmol/L

## 2021-04-10 LAB — LIPID PANEL
Cholesterol: 163 mg/dL (ref <200)
HDL: 52 mg/dL (ref >=50)
LDL Cholesterol (Calc): 95 mg/dL (calc)
Non-HDL Cholesterol (Calc): 111 mg/dL (calc) (ref <130)
Total CHOL/HDL Ratio: 3.1 (calc) (ref <5.0)
Triglycerides: 74 mg/dL (ref <150)

## 2021-04-10 LAB — VITAMIN B12: Vitamin B-12: 302 pg/mL (ref 200–1100)

## 2021-04-10 LAB — VITAMIN D 25 HYDROXY (VIT D DEFICIENCY, FRACTURES): Vit D, 25-Hydroxy: 14 ng/mL — ABNORMAL LOW (ref 30–100)

## 2021-04-10 LAB — T4, FREE: Free T4: 1.1 ng/dL (ref 0.8–1.8)

## 2021-05-08 DIAGNOSIS — R413 Other amnesia: Secondary | ICD-10-CM | POA: Diagnosis not present

## 2021-05-08 DIAGNOSIS — G479 Sleep disorder, unspecified: Secondary | ICD-10-CM | POA: Diagnosis not present

## 2021-07-06 DIAGNOSIS — R413 Other amnesia: Secondary | ICD-10-CM | POA: Diagnosis not present

## 2021-07-06 DIAGNOSIS — G479 Sleep disorder, unspecified: Secondary | ICD-10-CM | POA: Diagnosis not present

## 2021-08-07 DIAGNOSIS — H35341 Macular cyst, hole, or pseudohole, right eye: Secondary | ICD-10-CM | POA: Diagnosis not present

## 2021-08-07 DIAGNOSIS — H2513 Age-related nuclear cataract, bilateral: Secondary | ICD-10-CM | POA: Diagnosis not present

## 2021-08-07 DIAGNOSIS — H43811 Vitreous degeneration, right eye: Secondary | ICD-10-CM | POA: Diagnosis not present

## 2021-08-07 DIAGNOSIS — H1045 Other chronic allergic conjunctivitis: Secondary | ICD-10-CM | POA: Diagnosis not present

## 2021-10-05 ENCOUNTER — Other Ambulatory Visit: Payer: Self-pay | Admitting: Family Medicine

## 2021-10-05 DIAGNOSIS — G479 Sleep disorder, unspecified: Secondary | ICD-10-CM | POA: Diagnosis not present

## 2021-10-05 DIAGNOSIS — R413 Other amnesia: Secondary | ICD-10-CM | POA: Diagnosis not present

## 2021-10-05 DIAGNOSIS — I10 Essential (primary) hypertension: Secondary | ICD-10-CM

## 2021-10-06 NOTE — Telephone Encounter (Signed)
Refilled 12/05/2020 #90 3 refills - 1 year supply. Requested Prescriptions  Pending Prescriptions Disp Refills  . metoprolol succinate (TOPROL-XL) 25 MG 24 hr tablet [Pharmacy Med Name: METOPROLOL ER SUCCINATE 25MG  TABS] 90 tablet 3    Sig: TAKE 1 TABLET(25 MG) BY MOUTH DAILY     Cardiovascular:  Beta Blockers Passed - 10/05/2021  1:43 PM      Passed - Last BP in normal range    BP Readings from Last 1 Encounters:  04/09/21 108/63         Passed - Last Heart Rate in normal range    Pulse Readings from Last 1 Encounters:  04/09/21 63         Passed - Valid encounter within last 6 months    Recent Outpatient Visits          6 months ago Mild cognitive impairment with memory loss   [facility] [place], [place], DO   10 months ago Annual physical exam   [facility] [place], [place], DO   1 year ago Major depressive disorder, recurrent, moderate (HCC)   [facility], DO   1 year ago Major depressive disorder, recurrent, moderate (HCC)   [facility], DO   1 year ago Major depressive disorder, recurrent, moderate (HCC)   [facility], Althea Charon, DO      Future Appointments            In 4 months [facility], [facility]

## 2021-11-17 ENCOUNTER — Other Ambulatory Visit: Payer: Self-pay | Admitting: Family Medicine

## 2021-11-18 NOTE — Telephone Encounter (Signed)
Requested Prescriptions  Pending Prescriptions Disp Refills  . fluticasone (FLONASE) 50 MCG/ACT nasal spray [Pharmacy Med Name: FLUTICASONE NASAL SP (120) RX] 48 g 1    Sig: SHAKE LIQUID AND USE 2 SPRAYS IN EACH NOSTRIL DAILY     Ear, Nose, and Throat: Nasal Preparations - Corticosteroids Passed - 11/17/2021  7:07 AM      Passed - Valid encounter within last 12 months    Recent Outpatient Visits          7 months ago Mild cognitive impairment with memory loss   [facility] Smitty Cords, DO   11 months ago Annual physical exam   [facility] Smitty Cords, DO   1 year ago Major depressive disorder, recurrent, moderate (HCC)   [facility] Smitty [facility]ords, DO   1 year ago Major depressive disorder, recurrent, moderate (HCC)   [facility] Smitty Cords, DO   1 year ago Major depressive disorder, recurrent, moderate (HCC)   [facility] Althea Charon, Netta Neat, DO      Future Appointments            In 3 months [facility], [facility]

## 2021-12-17 ENCOUNTER — Other Ambulatory Visit: Payer: Self-pay | Admitting: Family Medicine

## 2021-12-17 DIAGNOSIS — I10 Essential (primary) hypertension: Secondary | ICD-10-CM

## 2021-12-18 NOTE — Telephone Encounter (Signed)
Requested Prescriptions  Pending Prescriptions Disp Refills  . metoprolol succinate (TOPROL-XL) 25 MG 24 hr tablet [Pharmacy Med Name: METOPROLOL ER SUCCINATE 25MG  TABS] 90 tablet 0    Sig: TAKE 1 TABLET(25 MG) BY MOUTH DAILY     Cardiovascular:  Beta Blockers Failed - 12/17/2021  7:03 AM      Failed - Valid encounter within last 6 months    Recent Outpatient Visits          8 months ago Mild cognitive impairment with memory loss   [facility] [place], [place], DO   1 year ago Annual physical exam   [facility], DO   1 year ago Major depressive disorder, recurrent, moderate (HCC)   [facility], DO   1 year ago Major depressive disorder, recurrent, moderate (HCC)   [facility], DO   2 years ago Major depressive disorder, recurrent, moderate (HCC)   [facility], DO      Future Appointments            In 2 months [facility]            Passed - Last BP in normal range    BP Readings from Last 1 Encounters:  04/09/21 108/63         Passed - Last Heart Rate in normal range    Pulse Readings from Last 1 Encounters:  04/09/21 63

## 2021-12-21 ENCOUNTER — Other Ambulatory Visit: Payer: Self-pay | Admitting: Family Medicine

## 2021-12-21 DIAGNOSIS — I10 Essential (primary) hypertension: Secondary | ICD-10-CM

## 2021-12-21 DIAGNOSIS — E039 Hypothyroidism, unspecified: Secondary | ICD-10-CM

## 2021-12-23 NOTE — Telephone Encounter (Signed)
Called pt and made appt for 12/28/21 90 day courtesy refill given. Requested Prescriptions  Pending Prescriptions Disp Refills  . levothyroxine (SYNTHROID) 25 MCG tablet [Pharmacy Med Name: LEVOTHYROXINE 0.025MG  ( ) TAB] 90 tablet 0    Sig: TAKE 1 TABLET(25 MCG) BY MOUTH DAILY BEFORE BREAKFAST     Endocrinology:  Hypothyroid Agents Passed - 12/21/2021  1:19 PM      Passed - TSH in normal range and within 360 days    TSH  Date Value Ref Range Status  04/09/2021 1.76 0.40 - 4.50 mIU/L Final         Passed - Valid encounter within last 12 months    Recent Outpatient Visits          8 months ago Mild cognitive impairment with memory loss   [facility] Althea Charon, Net[place] Neat, DO   1 year ago Annual physical exam   [facility] Smitty Cords, DO   1 year ago Major depressive disorder, recurrent, moderate (HCC)   [facility] [place], Netta Neat, DO   1 year ago Major depressive disorder, recurrent, moderate (HCC)   [facility], Netta Neat, DO   2 years ago Major depressive disorder, recurrent, moderate (HCC)   [facility] Althea Charon, [place] Neat, DO      Future Appointments            In 5 days Althea Charon, [place] Neat, DO [facility], [facility]   In 1 month  [facility]           . amLODipine (NORVASC) 5 MG tablet [Pharmacy Med Name: AMLODIPINE BESYLATE 5MG  TABLETS] 90 tablet 0    Sig: TAKE 1 TABLET(5 MG) BY MOUTH DAILY     Cardiovascular: Calcium Channel Blockers 2 Failed - 12/21/2021  1:19 PM      Failed - Valid encounter within last 6 months    Recent Outpatient Visits          8 months ago Mild cognitive impairment with memory loss   [facility] [place], [place], DO   1 year ago Annual physical exam   [facility], DO   1 year ago Major depressive disorder,  recurrent, moderate (HCC)   [facility] [place], [place], DO   1 year ago Major depressive disorder, recurrent, moderate (HCC)   [facility], DO   2 years ago Major depressive disorder, recurrent, moderate (HCC)   [facility], Althea Charon, DO      Future Appointments            In 5 days Netta Neat, Althea Charon, DO [facility]   In 1 month  [facility]           Passed - Last BP in normal range    BP Readings from Last 1 Encounters:  04/09/21 108/63         Passed - Last Heart Rate in normal range    Pulse Readings from Last 1 Encounters:  04/09/21 63

## 2021-12-28 ENCOUNTER — Ambulatory Visit: Payer: Medicare Other | Admitting: Family Medicine

## 2021-12-28 ENCOUNTER — Telehealth: Payer: Self-pay | Admitting: Family Medicine

## 2021-12-28 NOTE — Telephone Encounter (Signed)
error 

## 2022-01-12 ENCOUNTER — Ambulatory Visit: Payer: Medicare Other | Admitting: Family Medicine

## 2022-01-16 ENCOUNTER — Other Ambulatory Visit: Payer: Self-pay | Admitting: Family Medicine

## 2022-01-16 DIAGNOSIS — E785 Hyperlipidemia, unspecified: Secondary | ICD-10-CM

## 2022-01-18 NOTE — Telephone Encounter (Signed)
Requested Prescriptions  Pending Prescriptions Disp Refills  . atorvastatin (LIPITOR) 20 MG tablet [Pharmacy Med Name: ATORVASTATIN 20MG  TABLETS] 90 tablet 0    Sig: TAKE 1 TABLET(20 MG) BY MOUTH AT BEDTIME     Cardiovascular:  Antilipid - Statins Failed - 01/16/2022  7:01 AM      Failed - Lipid Panel in normal range within the last 12 months    Cholesterol, Total  Date Value Ref Range Status  12/12/2014 162 100 - 199 mg/dL Final   Cholesterol  Date Value Ref Range Status  04/09/2021 163 <200 mg/dL Final   LDL Cholesterol (Calc)  Date Value Ref Range Status  04/09/2021 95 mg/dL (calc) Final    Comment:    Reference range: <100 . Desirable range <100 mg/dL for primary prevention;   <70 mg/dL for patients with CHD or diabetic patients  with > or = 2 CHD risk factors. Marland Kitchen LDL-C is now calculated using the Martin-Hopkins  calculation, which is a validated novel method providing  better accuracy than the Friedewald equation in the  estimation of LDL-C.  Cresenciano Genre et al. Annamaria Helling. 9326;712(45): 2061-2068  (http://education.[employer].com/faq/FAQ164)    HDL  Date Value Ref Range Status  04/09/2021 52 > OR = 50 mg/dL Final  12/12/2014 47 >39 mg/dL Final    Comment:    According to ATP-III Guidelines, HDL-C >59 mg/dL is considered a negative risk factor for CHD.    Triglycerides  Date Value Ref Range Status  04/09/2021 74 <150 mg/dL Final         Passed - Patient is not pregnant      Passed - Valid encounter within last 12 months    Recent Outpatient Visits          9 months ago Mild cognitive impairment with memory loss   [place], DO   1 year ago Annual physical exam   [facility] Olin Hauser, DO   1 year ago Major depressive disorder, recurrent, moderate ([place])   [place], DO   1 year ago Major depressive disorder, recurrent, moderate ([place])   [place], DO   2 years ago Major depressive disorder, recurrent, moderate ([place])   [facility], Devonne Doughty, DO      Future Appointments            Tomorrow [place], Devonne Doughty, DO [facility], [place]   In 4 weeks  [facility], [facility]

## 2022-01-19 ENCOUNTER — Ambulatory Visit: Payer: Medicare Other | Admitting: Family Medicine

## 2022-01-24 ENCOUNTER — Other Ambulatory Visit: Payer: Self-pay | Admitting: Family Medicine

## 2022-01-24 DIAGNOSIS — I10 Essential (primary) hypertension: Secondary | ICD-10-CM

## 2022-01-25 ENCOUNTER — Other Ambulatory Visit: Payer: Self-pay | Admitting: Family Medicine

## 2022-01-25 DIAGNOSIS — H2513 Age-related nuclear cataract, bilateral: Secondary | ICD-10-CM | POA: Diagnosis not present

## 2022-01-25 DIAGNOSIS — H43811 Vitreous degeneration, right eye: Secondary | ICD-10-CM | POA: Diagnosis not present

## 2022-01-25 DIAGNOSIS — H35341 Macular cyst, hole, or pseudohole, right eye: Secondary | ICD-10-CM | POA: Diagnosis not present

## 2022-01-25 DIAGNOSIS — H1045 Other chronic allergic conjunctivitis: Secondary | ICD-10-CM | POA: Diagnosis not present

## 2022-01-25 DIAGNOSIS — I10 Essential (primary) hypertension: Secondary | ICD-10-CM

## 2022-01-25 NOTE — Telephone Encounter (Signed)
Requested Prescriptions  Pending Prescriptions Disp Refills  . hydrochlorothiazide (HYDRODIURIL) 25 MG tablet [Pharmacy Med Name: HYDROCHLOROTHIAZIDE 25MG  TABLETS] 30 tablet 0    Sig: TAKE 1 TABLET(25 MG) BY MOUTH DAILY     Cardiovascular: Diuretics - Thiazide Failed - 01/24/2022  1:12 PM      Failed - Cr in normal range and within 180 days    Creat  Date Value Ref Range Status  04/09/2021 0.63 0.60 - 1.00 mg/dL Final         Failed - K in normal range and within 180 days    Potassium  Date Value Ref Range Status  04/09/2021 3.3 (L) 3.5 - 5.3 mmol/L Final         Failed - Na in normal range and within 180 days    Sodium  Date Value Ref Range Status  04/09/2021 140 135 - 146 mmol/L Final  09/02/2015 139 134 - 144 mmol/L Final         Failed - Valid encounter within last 6 months    Recent Outpatient Visits          9 months ago Mild cognitive impairment with memory loss   [place], DO   1 year ago Annual physical exam   [place], DO   1 year ago Major depressive disorder, recurrent, moderate ([place])   [place], DO   2 years ago Major depressive disorder, recurrent, moderate ([place])   [place], DO   2 years ago Major depressive disorder, recurrent, moderate ([place])   [place], DO      Future Appointments            In 1 week [place], Devonne Doughty, DO [facility], [place]   In 3 weeks  [place] BP in normal range    BP Readings from Last 1 Encounters:  04/09/21 108/63

## 2022-01-26 NOTE — Telephone Encounter (Signed)
rx was sent yesterday #30/0, pt is needing updated appt and labs.   Requested Prescriptions  Refused Prescriptions Disp Refills  . hydrochlorothiazide (HYDRODIURIL) 25 MG tablet [Pharmacy Med Name: HYDROCHLOROTHIAZIDE 25MG  TABLETS] 90 tablet     Sig: TAKE 1 TABLET(25 MG) BY MOUTH DAILY     Cardiovascular: Diuretics - Thiazide Failed - 01/25/2022  4:11 PM      Failed - Cr in normal range and within 180 days    Creat  Date Value Ref Range Status  04/09/2021 0.63 0.60 - 1.00 mg/dL Final         Failed - K in normal range and within 180 days    Potassium  Date Value Ref Range Status  04/09/2021 3.3 (L) 3.5 - 5.3 mmol/L Final         Failed - Na in normal range and within 180 days    Sodium  Date Value Ref Range Status  04/09/2021 140 135 - 146 mmol/L Final  09/02/2015 139 134 - 144 mmol/L Final         Failed - Valid encounter within last 6 months    Recent Outpatient Visits          9 months ago Mild cognitive impairment with memory loss   [place], DO   1 year ago Annual physical exam   [place], DO   1 year ago Major depressive disorder, recurrent, moderate ([place])   [place], DO   2 years ago Major depressive disorder, recurrent, moderate ([place])   [place], DO   2 years ago Major depressive disorder, recurrent, moderate ([place])   [place], DO      Future Appointments            In 6 days [place], DO [facility], [place]   In 3 weeks  [place] BP in normal range    BP Readings from Last 1 Encounters:  04/09/21 108/63

## 2022-02-01 ENCOUNTER — Encounter: Payer: Self-pay | Admitting: Family Medicine

## 2022-02-01 ENCOUNTER — Ambulatory Visit (INDEPENDENT_AMBULATORY_CARE_PROVIDER_SITE_OTHER): Payer: Medicare Other | Admitting: Family Medicine

## 2022-02-01 VITALS — BP 118/64 | HR 59 | Ht 68.0 in | Wt 174.2 lb

## 2022-02-01 DIAGNOSIS — R413 Other amnesia: Secondary | ICD-10-CM | POA: Diagnosis not present

## 2022-02-01 DIAGNOSIS — F411 Generalized anxiety disorder: Secondary | ICD-10-CM

## 2022-02-01 DIAGNOSIS — E538 Deficiency of other specified B group vitamins: Secondary | ICD-10-CM

## 2022-02-01 DIAGNOSIS — I10 Essential (primary) hypertension: Secondary | ICD-10-CM

## 2022-02-01 DIAGNOSIS — R7309 Other abnormal glucose: Secondary | ICD-10-CM

## 2022-02-01 DIAGNOSIS — E785 Hyperlipidemia, unspecified: Secondary | ICD-10-CM

## 2022-02-01 DIAGNOSIS — E039 Hypothyroidism, unspecified: Secondary | ICD-10-CM | POA: Diagnosis not present

## 2022-02-01 DIAGNOSIS — Z0000 Encounter for general adult medical examination without abnormal findings: Secondary | ICD-10-CM | POA: Diagnosis not present

## 2022-02-01 DIAGNOSIS — M159 Polyosteoarthritis, unspecified: Secondary | ICD-10-CM

## 2022-02-01 DIAGNOSIS — E559 Vitamin D deficiency, unspecified: Secondary | ICD-10-CM

## 2022-02-01 DIAGNOSIS — F5104 Psychophysiologic insomnia: Secondary | ICD-10-CM

## 2022-02-01 DIAGNOSIS — Z23 Encounter for immunization: Secondary | ICD-10-CM | POA: Diagnosis not present

## 2022-02-01 DIAGNOSIS — F331 Major depressive disorder, recurrent, moderate: Secondary | ICD-10-CM

## 2022-02-01 MED ORDER — METOPROLOL SUCCINATE ER 25 MG PO TB24: 25.0000 mg | ORAL_TABLET | Freq: Every day | ORAL | 3 refills | Status: DC

## 2022-02-01 MED ORDER — ATORVASTATIN CALCIUM 20 MG PO TABS: 20.0000 mg | ORAL_TABLET | Freq: Every day | ORAL | 3 refills | Status: DC

## 2022-02-01 MED ORDER — SERTRALINE HCL 50 MG PO TABS: 50.0000 mg | ORAL_TABLET | Freq: Every day | ORAL | 3 refills | Status: DC

## 2022-02-01 MED ORDER — LEVOTHYROXINE SODIUM 25 MCG PO TABS: 25.0000 ug | ORAL_TABLET | Freq: Every day | ORAL | 3 refills | Status: DC

## 2022-02-01 MED ORDER — AMLODIPINE BESYLATE 5 MG PO TABS: 5.0000 mg | ORAL_TABLET | Freq: Every day | ORAL | 3 refills | Status: DC

## 2022-02-01 MED ORDER — HYDROCHLOROTHIAZIDE 25 MG PO TABS: 25.0000 mg | ORAL_TABLET | Freq: Every day | ORAL | 3 refills | Status: DC

## 2022-02-01 NOTE — Patient Instructions (Addendum)
Thank you for coming to the office today.  Pneumonia vaccine - Prevnar 20 - today   Please arrange to get Flu Shot and COVID shot at your pharmacy when ready.  Recommend Vitamin D3 capsules - lowest dose daily 1000 units (iu) or you can take max dose of 2000 units (iu)  If the result is significantly low, less 20. Then I would recommend Vitamin D3 5,000 unit per day for 3 months., then you can go back down to 1000 to 2000 daily maintenance.  Vitamin B12 1098mcg daily.  Recommend a meal replacement shake OTC Boost or Ensure if you are skipping or having a minimal meal due to time or stress etc, but prefer regular meals to avoid significant weight loss. But if you do skip then you can take one of these as a back up plan.  Labs today stay tuned for results.  All medicines re ordered for 1 year.  Please schedule a Follow-up Appointment to: Return in about 6 months (around 08/03/2022) for 6 month follow-up Sleep / Appetite / Mood/Anxiety.  If you have any other questions or concerns, please feel free to call the office or send a message through [facility]. You may also schedule an earlier appointment if necessary.  Additionally, you may be receiving a survey about your experience at our office within a few days to 1 week by e-mail or mail. We value your feedback.  Nobie Putnam, DO [place]

## 2022-02-01 NOTE — Progress Notes (Signed)
Subjective:    Patient ID: [name], female    DOB: 06-Jan-1951, 71 y.o.   MRN: [id number]  Sharon Craig is a 71 y.o. 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  She has had gradual progressive decline over past 4 years since 2018 with forgetfulness and memory Admits insomnia not always sleeping well at night, can wake up. She asks about sleeping medication or adjusting her mood med. - Her memory is affecting her still She has had 6CIT scores since 2018. There is a family history of Dementia maternal aunt  Taking Donepezil 78m daily Taking Mirtazapine 7.5 mg QHS PRN  Elevated A1c Due for lab, previous range 5-6   HYPERLIPIDEMIA: - Reports no concerns Fasting lipid - Currently taking Atorvastatin 225m tolerating well without side effects or myalgias Due for labs today   Hypothyroidism History of low thyroid, on levothyroxine 2553mdaily, doing well with it Due for labs   CHRONIC HTN: Reports no concerns.  Current Meds - amlodipine 5mg59mily , HCTZ 25mg35mly, Metoprolol 25mg 79maily   Reports good compliance, took meds today. Tolerating well, w/o complaints. Denies CP, dyspnea, HA, edema, dizziness / lightheadedness  Health Maintenance:  [facility] [employer] [employer] Pneumonia vaccine [id number]     02/01/2022    9:37 AM 02/10/2021    8:24 AM 12/05/2020    2:01 PM  Depression screen PHQ 2/9  Decreased Interest 0 0 1  Down, Depressed, Hopeless 1 0 1  PHQ - 2 Score 1 0 2  Altered sleeping 0  0   Tired, decreased energy 1  0  Change in appetite 1  0  Feeling bad or failure about yourself  0  0  Trouble concentrating 0  0  Moving slowly or fidgety/restless 0  0  Suicidal thoughts 0  0  PHQ-9 Score 3  2  Difficult doing work/chores Not difficult at all  Not difficult at all      02/01/2022    9:37 AM 06/13/2020   11:38 AM 01/24/2020    9:05 AM 12/14/2019   11:44 AM  GAD 7 : Generalized Anxiety Score  Nervous, Anxious, on Edge 0 _0 Control/stop worrying 0 _1 Worry too much - different things _2 Trouble relaxing 0 0 0 1  Restless 0 0 0 1  Easily annoyed or irritable 0 0 1 1  Afraid - awful might happen 0 _3 Total GAD 7 Score _4 Anxiety Difficulty Not difficult at all Not difficult at all Not difficult at all Not difficult at all      Past Medical History:  Diagnosis Date   Hyperlipidemia    Hypothyroidism    Past Surgical History:  Procedure Laterality Date   ABDOMINAL HYSTERECTOMY     COLONOSCOPY WITH PROPOFOL N/A 12/05/2014   Procedure: COLONOSCOPY WITH PROPOFOL;  Surgeon: Paul YHulen Luster [facility];  Service: Gastroenterology;  Laterality: N/A;   KNEE SURGERY Left  TUBAL LIGATION     Social History   Socioeconomic History   Marital status: Married    Spouse name: Not on file   Number of children: Not on file   Years of education: Not on file   Highest education level: 12th grade  Occupational History   Not on file  Tobacco Use   Smoking status: Never   Smokeless tobacco: Never  Vaping Use   Vaping Use: Never used  Substance and Sexual Activity   Alcohol use: No   Drug use: No   Sexual activity: Not Currently  Other Topics Concern   Not on file  Social History Narrative   Not on file   Social Determinants of Health   Financial Resource Strain: Low Risk  (02/10/2021)   Overall Financial Resource Strain (CARDIA)    Difficulty of Paying Living Expenses: Not hard at all  Food Insecurity: No Food Insecurity  (02/10/2021)   Hunger Vital Sign    Worried About Running Out of Food in the Last Year: Never true    [place] in the Last Year: Never true  Transportation Needs: No Transportation Needs (02/10/2021)   PRAPARE - Hydrologist (Medical): No    Lack of Transportation (Non-Medical): No  Physical Activity: Inactive (02/10/2021)   Exercise Vital Sign    Days of Exercise per Week: 0 days    Minutes of Exercise per Session: 0 min  Stress: Stress Concern Present (02/10/2021)   [place]    Feeling of Stress : To some extent  Social Connections: Moderately Integrated (01/17/2018)   Social Connection and Isolation Panel [NHANES]    Frequency of Communication with Friends and Family: More than three times a week    Frequency of Social Gatherings with Friends and Family: More than three times a week    Attends Religious Services: More than 4 times per year    Active Member of [employer] or Organizations: No    Attends Archivist Meetings: Never    Marital Status: Married  Human resources officer Violence: Not At Risk (01/17/2018)   Humiliation, Afraid, Rape, and Kick questionnaire    Fear of Current or Ex-Partner: No    Emotionally Abused: No    Physically Abused: No    Sexually Abused: No   Family History  Problem Relation Age of Onset   Heart failure Mother    Hypertension Mother    Gout Mother    Hypothyroidism Mother    Heart Problems Sister    Breast cancer Sister    Heart Problems Brother    Dementia Maternal Aunt    Current Outpatient Medications on File Prior to Visit  Medication Sig   Cholecalciferol (D 1000) 25 MCG (1000 UT) capsule Take by mouth.   cyanocobalamin (VITAMIN B12) 1000 MCG tablet Take by mouth.   donepezil (ARICEPT) 10 MG tablet Take 10 mg by mouth daily.   ferrous sulfate 325 (65 FE) MG tablet Take 325 mg by mouth daily with breakfast.   fluticasone (FLONASE) 50  MCG/ACT nasal spray SHAKE LIQUID AND USE 2 SPRAYS IN EACH NOSTRIL DAILY   mirtazapine (REMERON) 7.5 MG tablet Take 7.5 mg by mouth at bedtime.   No current facility-administered medications on file prior to visit.    Review of Systems  Constitutional:  Negative for activity change, appetite change, chills, diaphoresis, fatigue and fever.  HENT:  Negative for congestion and hearing loss.  Eyes:  Negative for visual disturbance.  Respiratory:  Negative for cough, chest tightness, shortness of breath and wheezing.   Cardiovascular:  Negative for chest pain, palpitations and leg swelling.  Gastrointestinal:  Negative for abdominal pain, constipation, diarrhea, nausea and vomiting.  Genitourinary:  Negative for dysuria, frequency and hematuria.  Musculoskeletal:  Negative for arthralgias and neck pain.  Skin:  Negative for rash.  Neurological:  Negative for dizziness, weakness, light-headedness, numbness and headaches.       Memory loss  Hematological:  Negative for adenopathy.  Psychiatric/Behavioral:  Negative for behavioral problems, dysphoric mood and sleep disturbance.    Per HPI unless specifically indicated above      Objective:    BP 118/64   Pulse (!) 59   Ht _0  (1.727 m)   Wt 174 lb 3.2 oz (79 kg)   SpO2 98%   BMI 26.49 kg/m   Wt Readings from Last 3 Encounters:  02/01/22 174 lb 3.2 oz (79 kg)  04/09/21 168 lb 6.4 oz (76.4 kg)  02/10/21 180 lb (81.6 kg)    Physical Exam Vitals and nursing note reviewed.  Constitutional:      General: She is not in acute distress.    Appearance: She is well-developed. She is not diaphoretic.     Comments: Well-appearing, comfortable, cooperative  HENT:     Head: Normocephalic and atraumatic.  Eyes:     General:        Right eye: No discharge.        Left eye: No discharge.     Conjunctiva/sclera: Conjunctivae normal.     Pupils: Pupils are equal, round, and reactive to light.  Neck:     Thyroid: No thyromegaly.   Cardiovascular:     Rate and Rhythm: Normal rate and regular rhythm.     Pulses: Normal pulses.     Heart sounds: Normal heart sounds. No murmur heard. Pulmonary:     Effort: Pulmonary effort is normal. No respiratory distress.     Breath sounds: Normal breath sounds. No wheezing or rales.  Abdominal:     General: Bowel sounds are normal. There is no distension.     Palpations: Abdomen is soft. There is no mass.     Tenderness: There is no abdominal tenderness.  Musculoskeletal:        General: No tenderness. Normal range of motion.     Cervical back: Normal range of motion and neck supple.     Comments: Upper / Lower Extremities: - Normal muscle tone, strength bilateral upper extremities 5/5, lower extremities 5/5  Lymphadenopathy:     Cervical: No cervical adenopathy.  Skin:    General: Skin is warm and dry.     Findings: No erythema or rash.  Neurological:     Mental Status: She is alert and oriented to person, place, and time.     Comments: Distal sensation intact to light touch all extremities  Psychiatric:        Mood and Affect: Mood normal.        Behavior: Behavior normal.        Thought Content: Thought content normal.     Comments: Well groomed, good eye contact, normal speech and thoughts      Results for orders placed or performed in visit on 04/09/21  COMPLETE METABOLIC PANEL WITH GFR  Result Value Ref Range   Glucose, Bld 107 (H) 65 - 99 mg/dL   BUN 15 7 - 25 mg/dL   Creat  0.63 0.60 - 1.00 mg/dL   eGFR 94 > OR = 60 mL/min/1.53m2   BUN/Creatinine Ratio NOT APPLICABLE 6 - 22 (calc)   Sodium 140 135 - 146 mmol/L   Potassium 3.3 (L) 3.5 - 5.3 mmol/L   Chloride 102 98 - 110 mmol/L   CO2 30 20 - 32 mmol/L   Calcium 9.2 8.6 - 10.4 mg/dL   Total Protein 6.5 6.1 - 8.1 g/dL   Albumin 3.9 3.6 - 5.1 g/dL   Globulin 2.6 1.9 - 3.7 g/dL (calc)   AG Ratio 1.5 1.0 - 2.5 (calc)   Total Bilirubin 0.8 0.2 - 1.2 mg/dL   Alkaline phosphatase (APISO) 78 37 - 153 U/L    AST 16 10 - 35 U/L   ALT 11 6 - 29 U/L  Lipid panel  Result Value Ref Range   Cholesterol 163 <200 mg/dL   HDL 52 > OR = 50 mg/dL   Triglycerides 74 <150 mg/dL   LDL Cholesterol (Calc) 95 mg/dL (calc)   Total CHOL/HDL Ratio 3.1 <5.0 (calc)   Non-HDL Cholesterol (Calc) 111 <130 mg/dL (calc)  CBC with Differential/Platelet  Result Value Ref Range   WBC 4.8 3.8 - 10.8 Thousand/uL   RBC 5.45 (H) 3.80 - 5.10 Million/uL   Hemoglobin 12.0 11.7 - 15.5 g/dL   HCT 39.2 35.0 - 45.0 %   MCV 71.9 (L) 80.0 - 100.0 fL   MCH 22.0 (L) 27.0 - 33.0 pg   MCHC 30.6 (L) 32.0 - 36.0 g/dL   RDW 15.2 (H) 11.0 - 15.0 %   Platelets 170 140 - 400 Thousand/uL   MPV 12.7 (H) 7.5 - 12.5 fL   Neutro Abs 2,530 1,500 - 7,800 cells/uL   Lymphs Abs 1,646 850 - 3,900 cells/uL   Absolute Monocytes 514 200 - 950 cells/uL   Eosinophils Absolute 72 15 - 500 cells/uL   Basophils Absolute 38 0 - 200 cells/uL   Neutrophils Relative % 52.7 %   Total Lymphocyte 34.3 %   Monocytes Relative 10.7 %   Eosinophils Relative 1.5 %   Basophils Relative 0.8 %  Hemoglobin A1c  Result Value Ref Range   Hgb A1c MFr Bld 5.6 <5.7 % of total Hgb   Mean Plasma Glucose 114 mg/dL   eAG (mmol/L) 6.3 mmol/L  TSH  Result Value Ref Range   TSH 1.76 0.40 - 4.50 mIU/L  T4, free  Result Value Ref Range   Free T4 1.1 0.8 - 1.8 ng/dL  Vitamin B12  Result Value Ref Range   Vitamin B-12 302 200 - 1,100 pg/mL  VITAMIN D 25 Hydroxy (Vit-D Deficiency, Fractures)  Result Value Ref Range   Vit D, 25-Hydroxy 14 (L) 30 - 100 ng/mL      Assessment & Plan:   Problem List Items Addressed This Visit     Benign essential HTN   Relevant Medications   amLODipine (NORVASC) 5 MG tablet   atorvastatin (LIPITOR) 20 MG tablet   hydrochlorothiazide (HYDRODIURIL) 25 MG tablet   metoprolol succinate (TOPROL-XL) 25 MG 24 hr tablet   Other Relevant Orders   COMPLETE METABOLIC PANEL WITH GFR   CBC with Differential/Platelet   Dyslipidemia   Relevant  Medications   atorvastatin (LIPITOR) 20 MG tablet   Other Relevant Orders   COMPLETE METABOLIC PANEL WITH GFR   CBC with Differential/Platelet   Lipid panel   Elevated hemoglobin A1c   Relevant Orders   Hemoglobin A1c   GAD (generalized anxiety disorder)   Relevant  Medications   mirtazapine (REMERON) 7.5 MG tablet   sertraline (ZOLOFT) 50 MG tablet   Hypothyroidism   Relevant Medications   levothyroxine (SYNTHROID) 25 MCG tablet   metoprolol succinate (TOPROL-XL) 25 MG 24 hr tablet   Other Relevant Orders   TSH   T4, free   Major depressive disorder, recurrent, moderate (HCC)   Relevant Medications   mirtazapine (REMERON) 7.5 MG tablet   sertraline (ZOLOFT) 50 MG tablet   Memory loss   Osteoarthritis of multiple joints   Psychophysiological insomnia   Relevant Medications   sertraline (ZOLOFT) 50 MG tablet   Other Visit Diagnoses     Annual physical exam    -  Primary   Relevant Orders   COMPLETE METABOLIC PANEL WITH GFR   CBC with Differential/Platelet   Lipid panel   Hemoglobin A1c   Need for Streptococcus pneumoniae vaccination       Relevant Orders   Pneumococcal conjugate vaccine 20-valent   Vitamin D deficiency       Relevant Orders   VITAMIN D 25 Hydroxy (Vit-D Deficiency, Fractures)   Vitamin B12 deficiency       Relevant Orders   Vitamin B12       Updated Health Maintenance information Fasting labs ordered today Encouraged improvement to lifestyle with diet and exercise  Pneumonia vaccine - Prevnar 20 - today   Please arrange to get Flu Shot and COVID shot at your pharmacy when ready.  Vit D deficiency Recommend Vitamin D3 capsules - lowest dose daily 1000 units (iu) or you can take max dose of 2000 units (iu) If the result is significantly low, less 20. Then I would recommend Vitamin D3 5,000 unit per day for 3 months., then you can go back down to 1000 to 2000 daily maintenance.  Continue Vitamin B12 1080mg daily.  Recommend a meal  replacement shake OTC Boost or Ensure if you are skipping or having a minimal meal due to time or stress etc, but prefer regular meals to avoid significant weight loss. But if you do skip then you can take one of these as a back up plan.  Insomnia Continue  Mirtazapine, helping sleep and appetite  Labs today stay tuned for results.  All medicines re ordered for 1 year.   Orders Placed This Encounter  Procedures   Pneumococcal conjugate vaccine 20-valent   COMPLETE METABOLIC PANEL WITH GFR   CBC with Differential/Platelet   Lipid panel    Order Specific Question:   Has the patient fasted?    Answer:   Yes   Hemoglobin A1c   TSH   T4, free   Vitamin B12   VITAMIN D 25 Hydroxy (Vit-D Deficiency, Fractures)     Meds ordered this encounter  Medications   amLODipine (NORVASC) 5 MG tablet    Sig: Take 1 tablet (5 mg total) by mouth daily.    Dispense:  90 tablet    Refill:  3    Add refills for 1 year   atorvastatin (LIPITOR) 20 MG tablet    Sig: Take 1 tablet (20 mg total) by mouth daily.    Dispense:  90 tablet    Refill:  3    Add refills for 1 year   hydrochlorothiazide (HYDRODIURIL) 25 MG tablet    Sig: Take 1 tablet (25 mg total) by mouth daily.    Dispense:  90 tablet    Refill:  3    Add refills for 1 year   levothyroxine (SYNTHROID)  25 MCG tablet    Sig: Take 1 tablet (25 mcg total) by mouth daily before breakfast.    Dispense:  90 tablet    Refill:  3    Add refills for 1 year   metoprolol succinate (TOPROL-XL) 25 MG 24 hr tablet    Sig: Take 1 tablet (25 mg total) by mouth daily.    Dispense:  90 tablet    Refill:  3    Add refills for 1 year   sertraline (ZOLOFT) 50 MG tablet    Sig: Take 1 tablet (50 mg total) by mouth daily.    Dispense:  90 tablet    Refill:  3    Add refills for 1 year     Follow up plan: Return in about 6 months (around 08/03/2022) for 6 month follow-up Sleep / Appetite / Mood/Anxiety.  Nobie Putnam, [facility] 02/01/2022, 9:54 AM

## 2022-02-02 LAB — CBC WITH DIFFERENTIAL/PLATELET
Absolute Monocytes: 546 cells/uL (ref 200–950)
Basophils Absolute: 58 cells/uL (ref 0–200)
Basophils Relative: 1.1 %
Eosinophils Absolute: 101 cells/uL (ref 15–500)
Eosinophils Relative: 1.9 %
HCT: 39.7 % (ref 35.0–45.0)
Hemoglobin: 12.3 g/dL (ref 11.7–15.5)
Lymphs Abs: 1844 cells/uL (ref 850–3900)
MCH: 21.8 pg — ABNORMAL LOW (ref 27.0–33.0)
MCHC: 31 g/dL — ABNORMAL LOW (ref 32.0–36.0)
MCV: 70.4 fL — ABNORMAL LOW (ref 80.0–100.0)
MPV: 11.4 fL (ref 7.5–12.5)
Monocytes Relative: 10.3 %
Neutro Abs: 2751 cells/uL (ref 1500–7800)
Neutrophils Relative %: 51.9 %
Platelets: 183 10*3/uL (ref 140–400)
RBC: 5.64 10*6/uL — ABNORMAL HIGH (ref 3.80–5.10)
RDW: 15.5 % — ABNORMAL HIGH (ref 11.0–15.0)
Total Lymphocyte: 34.8 %
WBC: 5.3 10*3/uL (ref 3.8–10.8)

## 2022-02-02 LAB — VITAMIN B12: Vitamin B-12: >2000 pg/mL — ABNORMAL HIGH (ref 200–1100)

## 2022-02-02 LAB — HEMOGLOBIN A1C
Hgb A1c MFr Bld: 6 % of total Hgb — ABNORMAL HIGH (ref <5.7)
Mean Plasma Glucose: 126 mg/dL
eAG (mmol/L): 7 mmol/L

## 2022-02-02 LAB — LIPID PANEL
Cholesterol: 150 mg/dL (ref <200)
HDL: 53 mg/dL (ref >=50)
LDL Cholesterol (Calc): 83 mg/dL (calc)
Non-HDL Cholesterol (Calc): 97 mg/dL (calc) (ref <130)
Total CHOL/HDL Ratio: 2.8 (calc) (ref <5.0)
Triglycerides: 61 mg/dL (ref <150)

## 2022-02-02 LAB — COMPLETE METABOLIC PANEL WITH GFR
AG Ratio: 1.6 (calc) (ref 1.0–2.5)
ALT: 12 U/L (ref 6–29)
AST: 12 U/L (ref 10–35)
Albumin: 4.3 g/dL (ref 3.6–5.1)
Alkaline phosphatase (APISO): 86 U/L (ref 37–153)
BUN/Creatinine Ratio: 32 (calc) — ABNORMAL HIGH (ref 6–22)
BUN: 27 mg/dL — ABNORMAL HIGH (ref 7–25)
CO2: 29 mmol/L (ref 20–32)
Calcium: 9.7 mg/dL (ref 8.6–10.4)
Chloride: 105 mmol/L (ref 98–110)
Creat: 0.84 mg/dL (ref 0.60–1.00)
Globulin: 2.7 g/dL (calc) (ref 1.9–3.7)
Glucose, Bld: 88 mg/dL (ref 65–99)
Potassium: 3.5 mmol/L (ref 3.5–5.3)
Sodium: 143 mmol/L (ref 135–146)
Total Bilirubin: 0.7 mg/dL (ref 0.2–1.2)
Total Protein: 7 g/dL (ref 6.1–8.1)
eGFR: 73 mL/min/{1.73_m2} (ref >=60)

## 2022-02-02 LAB — VITAMIN D 25 HYDROXY (VIT D DEFICIENCY, FRACTURES): Vit D, 25-Hydroxy: 65 ng/mL (ref 30–100)

## 2022-02-02 LAB — T4, FREE: Free T4: 1.1 ng/dL (ref 0.8–1.8)

## 2022-02-02 LAB — TSH: TSH: 1.69 mIU/L (ref 0.40–4.50)

## 2022-02-12 ENCOUNTER — Ambulatory Visit: Payer: Medicare Other

## 2022-02-16 ENCOUNTER — Ambulatory Visit: Payer: Medicare Other

## 2022-02-26 ENCOUNTER — Ambulatory Visit: Payer: Medicare Other

## 2022-03-13 ENCOUNTER — Other Ambulatory Visit: Payer: Self-pay | Admitting: Family Medicine

## 2022-03-13 DIAGNOSIS — E039 Hypothyroidism, unspecified: Secondary | ICD-10-CM

## 2022-03-13 DIAGNOSIS — I10 Essential (primary) hypertension: Secondary | ICD-10-CM

## 2022-03-16 NOTE — Telephone Encounter (Signed)
Both refilled 02/01/2022 #90 3 rf - same phar. Requested Prescriptions  Pending Prescriptions Disp Refills   levothyroxine (SYNTHROID) 25 MCG tablet [Pharmacy Med Name: LEVOTHYROXINE 0.025MG  ( ) TAB] 90 tablet 3    Sig: TAKE 1 TABLET(25 MCG) BY MOUTH DAILY BEFORE BREAKFAST     Endocrinology:  Hypothyroid Agents Passed - 03/13/2022  9:42 AM      Passed - TSH in normal range and within 360 days    TSH  Date Value Ref Range Status  02/01/2022 1.69 0.40 - 4.50 mIU/L Final         Passed - Valid encounter within last 12 months    Recent Outpatient Visits           1 month ago Annual physical exam   [facility] Smitty Cords, DO   11 months ago Mild cognitive impairment with memory loss   [facility] [place], Netta Neat, DO   1 year ago Annual physical exam   [facility] Smitty Cords, DO   1 year ago Major depressive disorder, recurrent, moderate (HCC)   [facility] [place], Netta Neat, DO   2 years ago Major depressive disorder, recurrent, moderate (HCC)   [facility] [place], Netta Neat, DO       Future Appointments             In 4 months Althea Charon, Netta Neat, DO [facility]             hydrochlorothiazide (HYDRODIURIL) 25 MG tablet [Pharmacy Med Name: HYDROCHLOROTHIAZIDE 25MG  TABLETS] 90 tablet 3    Sig: TAKE 1 TABLET(25 MG) BY MOUTH DAILY     Cardiovascular: Diuretics - Thiazide Passed - 03/13/2022  9:42 AM      Passed - Cr in normal range and within 180 days    Creat  Date Value Ref Range Status  02/01/2022 0.84 0.60 - 1.00 mg/dL Final         Passed - K in normal range and within 180 days    Potassium  Date Value Ref Range Status  02/01/2022 3.5 3.5 - 5.3 mmol/L Final         Passed - Na in normal range and within 180 days    Sodium  Date Value Ref Range Status  02/01/2022 143 135 - 146 mmol/L Final   09/02/2015 139 134 - 144 mmol/L Final         Passed - Last BP in normal range    BP Readings from Last 1 Encounters:  02/01/22 118/64         Passed - Valid encounter within last 6 months    Recent Outpatient Visits           1 month ago Annual physical exam   [facility], DO   11 months ago Mild cognitive impairment with memory loss   [facility], Althea Charon, DO   1 year ago Annual physical exam   [facility], DO   1 year ago Major depressive disorder, recurrent, moderate (HCC)   [facility] [place], [place], DO   2 years ago Major depressive disorder, recurrent, moderate (HCC)   [facility], Althea Charon, DO       Future Appointments             In 4 months Netta Neat  J, DO [facility]

## 2022-04-26 DIAGNOSIS — R413 Other amnesia: Secondary | ICD-10-CM | POA: Diagnosis not present

## 2022-04-26 DIAGNOSIS — F03A0 Unspecified dementia, mild, without behavioral disturbance, psychotic disturbance, mood disturbance, and anxiety: Secondary | ICD-10-CM | POA: Diagnosis not present

## 2022-04-26 DIAGNOSIS — G479 Sleep disorder, unspecified: Secondary | ICD-10-CM | POA: Diagnosis not present

## 2022-05-26 ENCOUNTER — Telehealth: Payer: Self-pay | Admitting: Family Medicine

## 2022-05-26 NOTE — Telephone Encounter (Signed)
Referral Request - Has patient seen PCP for this complaint? yes *If NO, is insurance requiring patient see PCP for this issue before PCP can refer them? Referral for which specialty: mammogram Preferred provider/office: ? Reason for referral: annual mammo

## 2022-07-16 ENCOUNTER — Other Ambulatory Visit: Payer: Self-pay | Admitting: Family Medicine

## 2022-07-16 DIAGNOSIS — Z1231 Encounter for screening mammogram for malignant neoplasm of breast: Secondary | ICD-10-CM

## 2022-07-28 ENCOUNTER — Ambulatory Visit: Payer: Medicare Other

## 2022-07-29 DIAGNOSIS — H1045 Other chronic allergic conjunctivitis: Secondary | ICD-10-CM | POA: Diagnosis not present

## 2022-07-29 DIAGNOSIS — H2513 Age-related nuclear cataract, bilateral: Secondary | ICD-10-CM | POA: Diagnosis not present

## 2022-07-29 DIAGNOSIS — H43811 Vitreous degeneration, right eye: Secondary | ICD-10-CM | POA: Diagnosis not present

## 2022-07-29 DIAGNOSIS — H35341 Macular cyst, hole, or pseudohole, right eye: Secondary | ICD-10-CM | POA: Diagnosis not present

## 2022-08-05 ENCOUNTER — Ambulatory Visit: Payer: Medicare Other | Admitting: Family Medicine

## 2022-08-13 ENCOUNTER — Ambulatory Visit
Admission: RE | Admit: 2022-08-13 | Discharge: 2022-08-13 | Disposition: A | Discharge status: Home / Self Care | Payer: Medicare Other | Source: Ambulatory Visit | Attending: Family Medicine | Admitting: Family Medicine

## 2022-08-13 DIAGNOSIS — Z1231 Encounter for screening mammogram for malignant neoplasm of breast: Secondary | ICD-10-CM | POA: Insufficient documentation

## 2022-08-25 ENCOUNTER — Telehealth: Payer: Self-pay | Admitting: Family Medicine

## 2022-08-25 NOTE — Telephone Encounter (Signed)
Pt is returning a call from the office. Pt is requesting someone call her back tomorrow morning.

## 2022-08-25 NOTE — Telephone Encounter (Signed)
Called patient to schedule Medicare Annual Wellness Visit (AWV). No voicemail available to leave a message.  Last date of AWV: 02/10/21  Please schedule an appointment at any time with Kennedy Bucker, LPN  .  If any questions, please contact me.  Thank you ,  Verlee Rossetti; Care Guide Ambulatory Clinical Support [facility] Direct Dial: [phone]

## 2022-10-09 IMAGING — MG MM DIGITAL DIAGNOSTIC UNILAT*L* W/ TOMO W/ CAD
4 series · 4 of 12 positions shown · non-contrast
Comparison: Previous exam(s).

CLINICAL DATA: Patient recalled from screening for left breast
mass.

EXAM:
DIGITAL DIAGNOSTIC UNILATERAL LEFT MAMMOGRAM WITH TOMOSYNTHESIS AND
CAD; ULTRASOUND LEFT BREAST LIMITED
TECHNIQUE: Left digital diagnostic mammography and breast tomosynthesis was
performed. The images were evaluated with computer-aided detection.;
Targeted ultrasound examination of the left breast was performed

[L MLO synth-2D]
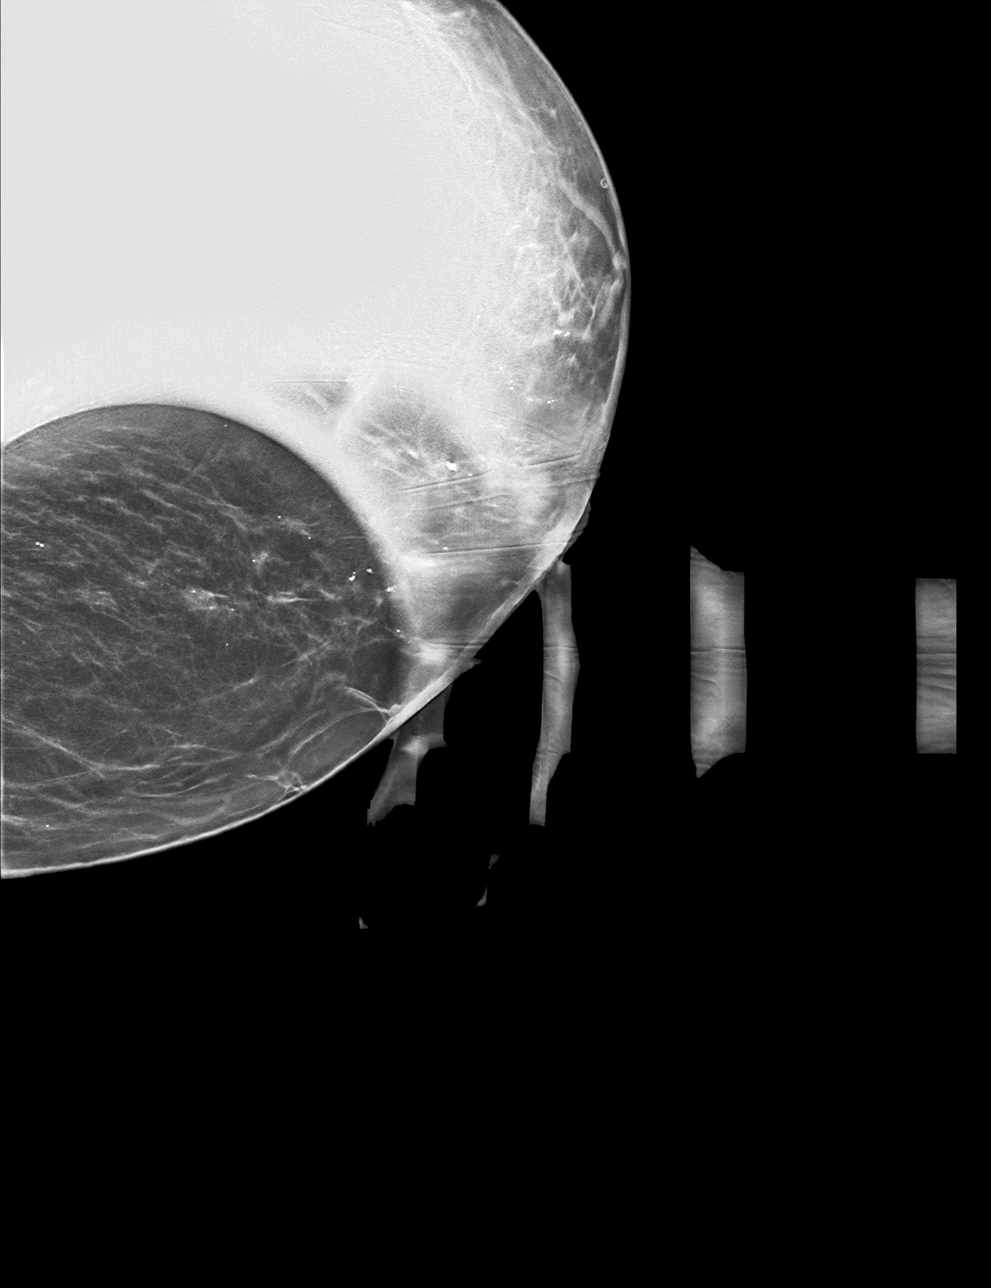

[L CC synth-2D]
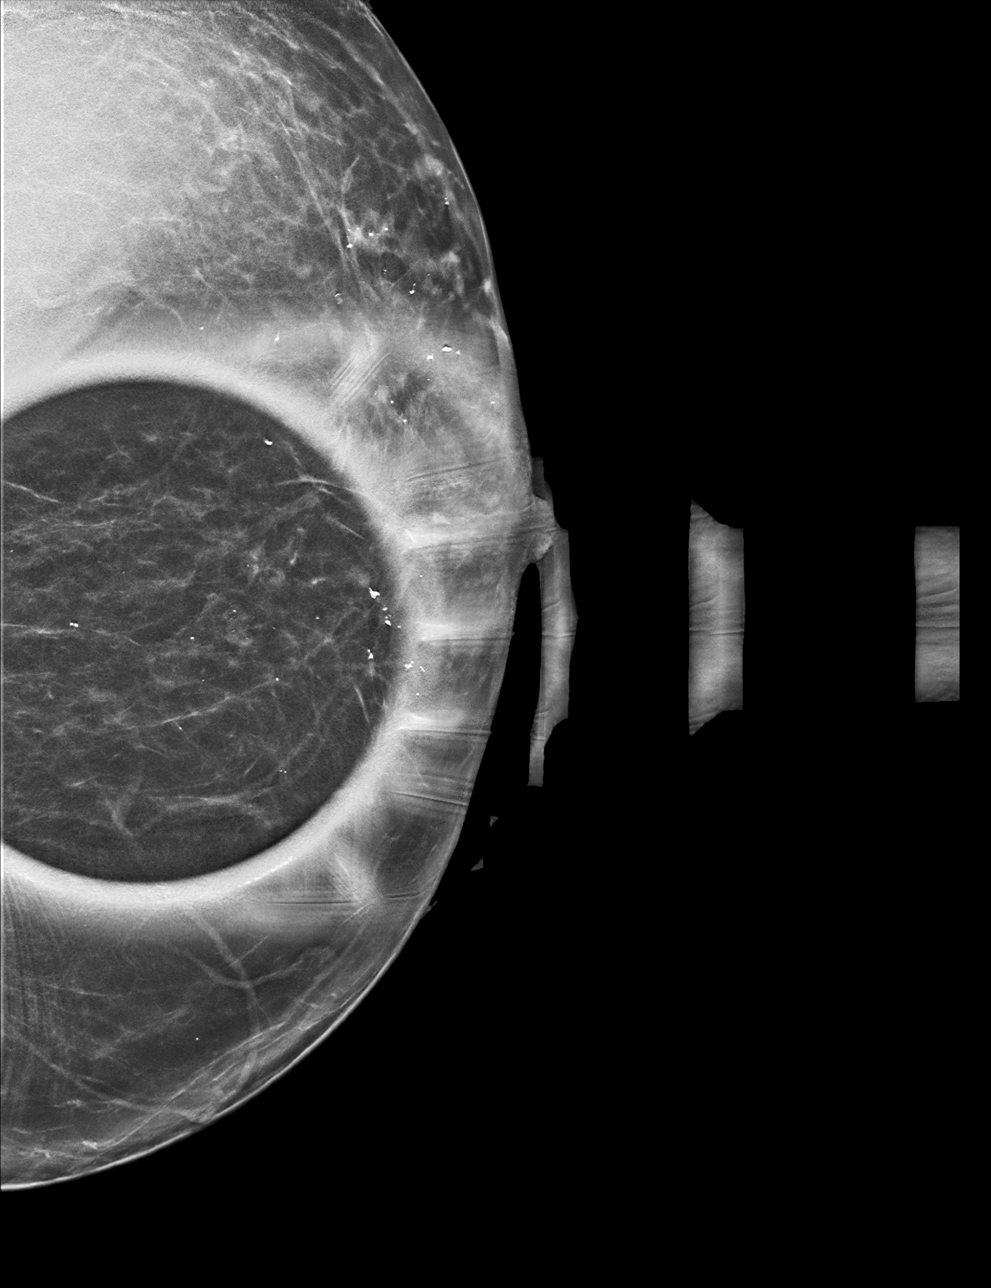

[L CC tomo · tomo slice 27/54.0]
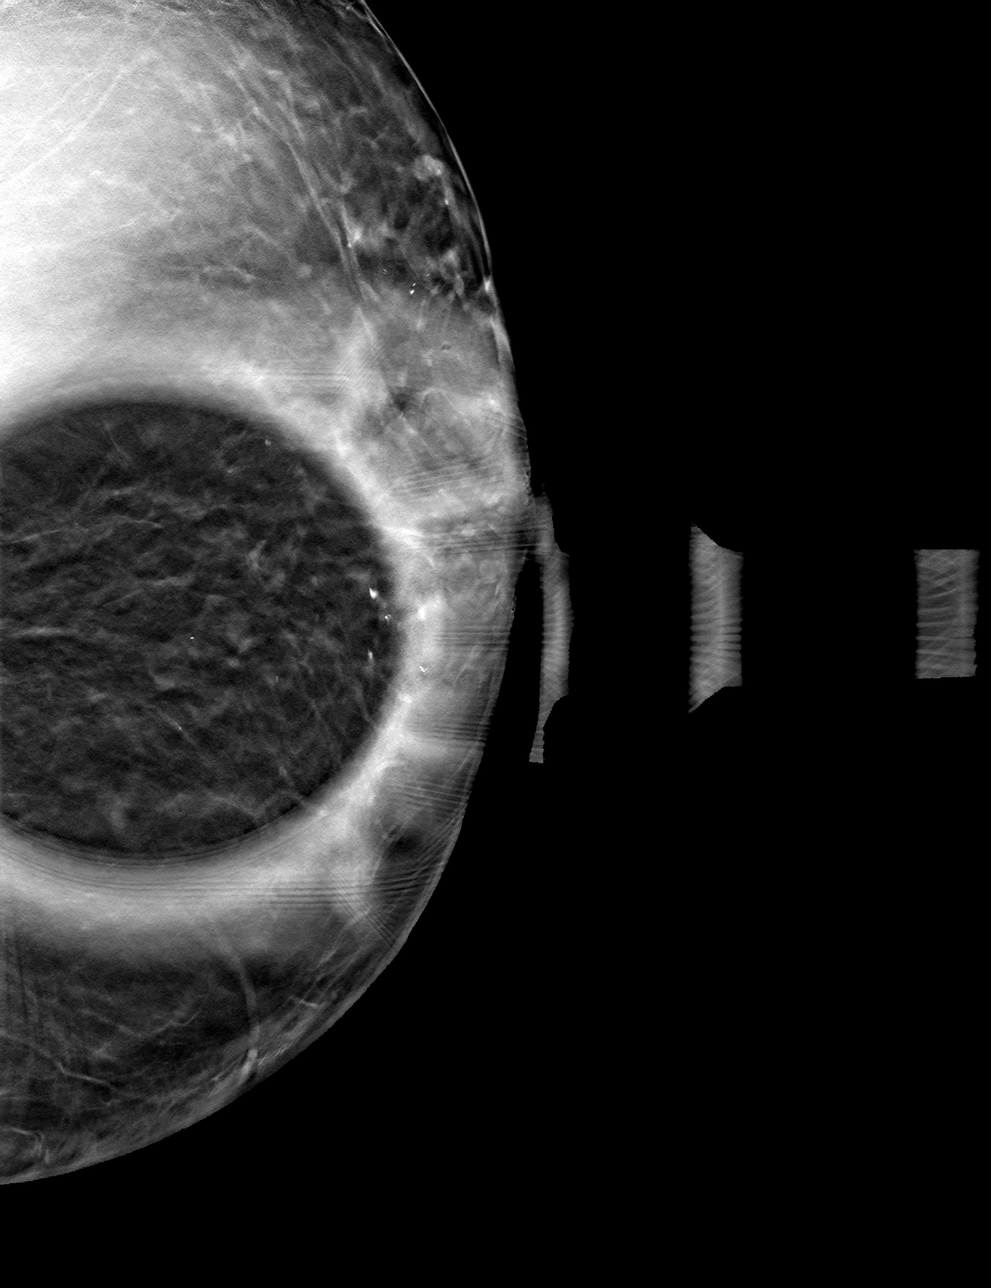

[L MLO tomo · tomo slice 27/53.0]
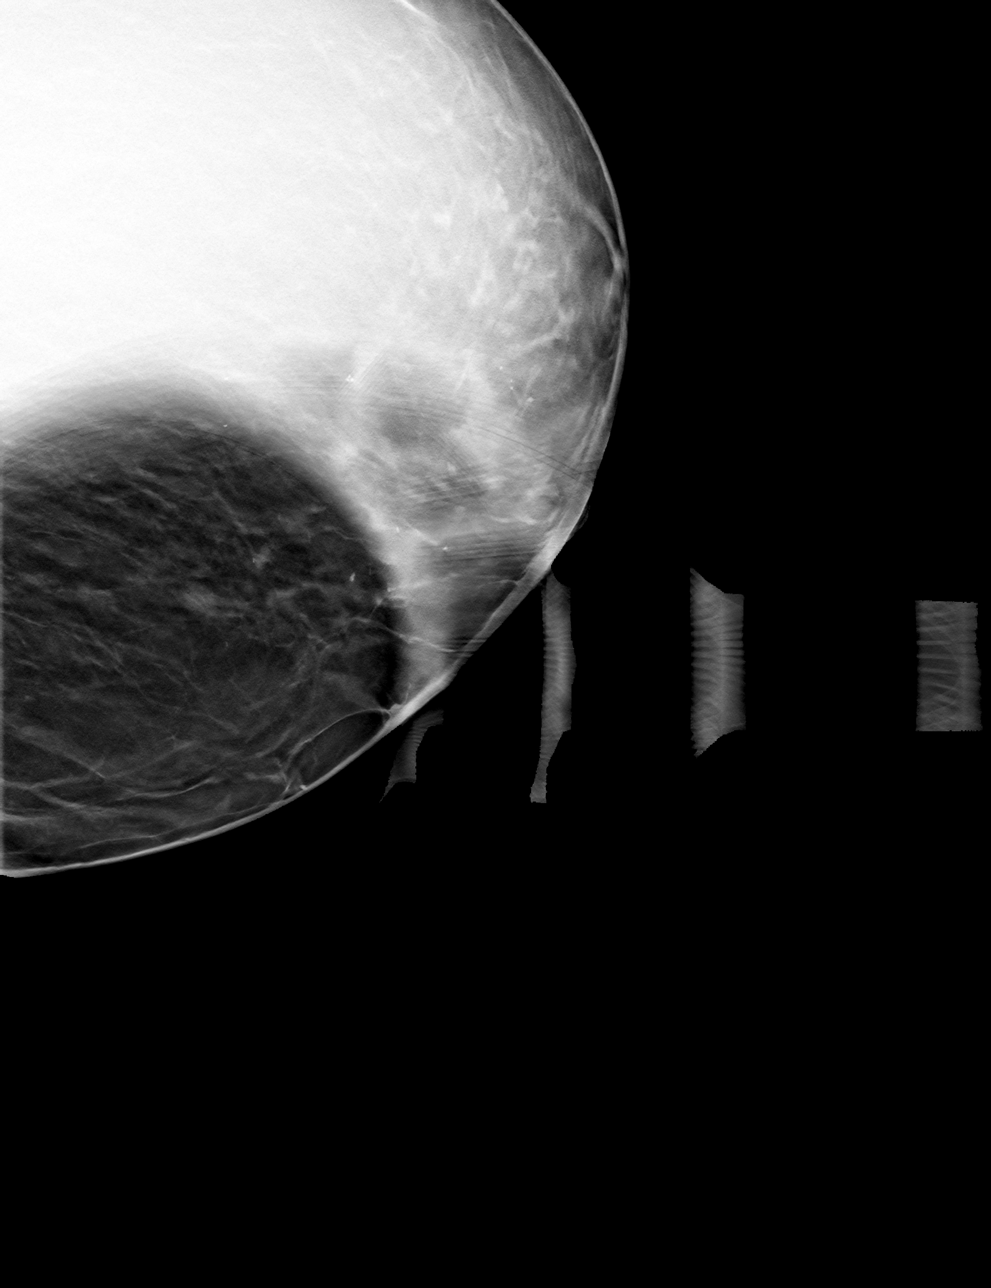

[4 of 12 positions shown; findings below may reference images not displayed]

ACR Breast Density Category b: There are scattered areas of
fibroglandular density.
FINDINGS: Within the anterior third of the left breast inferiorly there is a
persistent low-density lobular mass further evaluated with spot
compression views.

Targeted ultrasound is performed, showing a 9 x 3 x 4 mm cluster of
cysts left breast 7 o'clock position 2 cm from nipple.
IMPRESSION: Left breast cluster of cysts.

No mammographic evidence for malignancy.

RECOMMENDATION:
Screening mammogram in one year.(Code:M7-J-FWI)

I have discussed the findings and recommendations with the patient.
If applicable, a reminder letter will be sent to the patient
regarding the next appointment.

BI-RADS CATEGORY  2: Benign.

## 2022-11-01 DIAGNOSIS — R413 Other amnesia: Secondary | ICD-10-CM | POA: Diagnosis not present

## 2022-11-01 DIAGNOSIS — F03A0 Unspecified dementia, mild, without behavioral disturbance, psychotic disturbance, mood disturbance, and anxiety: Secondary | ICD-10-CM | POA: Diagnosis not present

## 2022-11-01 DIAGNOSIS — G479 Sleep disorder, unspecified: Secondary | ICD-10-CM | POA: Diagnosis not present

## 2022-11-05 ENCOUNTER — Encounter: Payer: Self-pay | Admitting: Internal Medicine

## 2022-11-05 ENCOUNTER — Ambulatory Visit (INDEPENDENT_AMBULATORY_CARE_PROVIDER_SITE_OTHER): Payer: Medicare Other

## 2022-11-05 ENCOUNTER — Ambulatory Visit (INDEPENDENT_AMBULATORY_CARE_PROVIDER_SITE_OTHER): Payer: Medicare Other | Admitting: Internal Medicine

## 2022-11-05 VITALS — BP 134/72 | HR 68 | Ht 68.0 in | Wt 176.0 lb

## 2022-11-05 VITALS — BP 132/60 | Ht 69.0 in | Wt 179.2 lb

## 2022-11-05 DIAGNOSIS — N6459 Other signs and symptoms in breast: Secondary | ICD-10-CM

## 2022-11-05 DIAGNOSIS — Z0000 Encounter for general adult medical examination without abnormal findings: Secondary | ICD-10-CM | POA: Diagnosis not present

## 2022-11-05 DIAGNOSIS — Z780 Asymptomatic menopausal state: Secondary | ICD-10-CM

## 2022-11-05 NOTE — Patient Instructions (Signed)
Sharon Craig , Thank you for taking time to come for your Medicare Wellness Visit. I appreciate your ongoing commitment to your health goals. Please review the following plan we discussed and let me know if I can assist you in the future.   These are the goals we discussed:  Goals      DIET - EAT MORE FRUITS AND VEGETABLES     Patient Stated     02/10/2021, no goals     PharmD - Medication Adherence     CARE PLAN ENTRY (see longitudinal plan of care for additional care plan information)   Current Barriers:  Chronic Disease Management support, education, and care coordination needs related to HTN, HLD and hypothyroidism Need for evaluation of medication adherence, as identified by patient's health plan, based on cholesterol medication adherence quality data  Pharmacist Clinical Goal(s):  Over the next 30 days, patient will work with CM Pharmacist to address needs related to medication adherence  Interventions: Inter-disciplinary care team collaboration (see longitudinal plan of care) Assess adherence to atorvastatin (as identified by health plan).  Patient denies missed doses of atorvastatin 20 mg once daily.  Review dispensing history in chart. Note Rx last filled on 07/22/19 for 90 day supply Encourage patient to continue adherence to medication and discuss timing of medication administration to aid with adherence Counsel patient on health plan over the counter benefit  Agree to follow up with patient further another day as requested by patient.   Patient Self Care Activities:  Patient to call provider office for new concerns or questions   Initial goal documentation         This is a list of the screening recommended for you and due dates:  Health Maintenance  Topic Date Due   DTaP/Tdap/Td vaccine (1 - Tdap) Never done   Zoster (Shingles) Vaccine (1 of 2) Never done   COVID-19 Vaccine (3 - Moderna risk series) 12/30/2019   Flu Shot  11/18/2022   Medicare Annual  Wellness Visit  11/05/2023   Mammogram  08/12/2024   Colon Cancer Screening  12/04/2024   Pneumonia Vaccine  Completed   DEXA scan (bone density measurement)  Completed   Hepatitis C Screening  Completed   HPV Vaccine  Aged Out    Advanced directives: no  Conditions/risks identified: no  Next appointment: Follow up in one year for your annual wellness visit 11/17/23 @ 2:00 pm in person   Preventive Care 65 Years and Older, Female Preventive care refers to lifestyle choices and visits with your health care provider that can promote health and wellness. What does preventive care include? A yearly physical exam. This is also called an annual well check. Dental exams once or twice a year. Routine eye exams. Ask your health care provider how often you should have your eyes checked. Personal lifestyle choices, including: Daily care of your teeth and gums. Regular physical activity. Eating a healthy diet. Avoiding tobacco and drug use. Limiting alcohol use. Practicing safe sex. Taking low-dose aspirin every day. Taking vitamin and mineral supplements as recommended by your health care provider. What happens during an annual well check? The services and screenings done by your health care provider during your annual well check will depend on your age, overall health, lifestyle risk factors, and family history of disease. Counseling  Your health care provider may ask you questions about your: Alcohol use. Tobacco use. Drug use. Emotional well-being. Home and relationship well-being. Sexual activity. Eating habits. History of falls. Memory and  ability to understand (cognition). Work and work Astronomer. Reproductive health. Screening  You may have the following tests or measurements: Height, weight, and BMI. Blood pressure. Lipid and cholesterol levels. These may be checked every 5 years, or more frequently if you are over 49 years old. Skin check. Lung cancer screening. You  may have this screening every year starting at age 26 if you have a 30-pack-year history of smoking and currently smoke or have quit within the past 15 years. Fecal occult blood test (FOBT) of the stool. You may have this test every year starting at age 55. Flexible sigmoidoscopy or colonoscopy. You may have a sigmoidoscopy every 5 years or a colonoscopy every 10 years starting at age 74. Hepatitis C blood test. Hepatitis B blood test. Sexually transmitted disease (STD) testing. Diabetes screening. This is done by checking your blood sugar (glucose) after you have not eaten for a while (fasting). You may have this done every 1-3 years. Bone density scan. This is done to screen for osteoporosis. You may have this done starting at age 44. Mammogram. This may be done every 1-2 years. Talk to your health care provider about how often you should have regular mammograms. Talk with your health care provider about your test results, treatment options, and if necessary, the need for more tests. Vaccines  Your health care provider may recommend certain vaccines, such as: Influenza vaccine. This is recommended every year. Tetanus, diphtheria, and acellular pertussis (Tdap, Td) vaccine. You may need a Td booster every 10 years. Zoster vaccine. You may need this after age 49. Pneumococcal 13-valent conjugate (PCV13) vaccine. One dose is recommended after age 75. Pneumococcal polysaccharide (PPSV23) vaccine. One dose is recommended after age 13. Talk to your health care provider about which screenings and vaccines you need and how often you need them. This information is not intended to replace advice given to you by your health care provider. Make sure you discuss any questions you have with your health care provider. Document Released: 05/02/2015 Document Revised: 12/24/2015 Document Reviewed: 02/04/2015 Elsevier Interactive Patient Education  2017 [employer].  Fall Prevention in the Home Falls can  cause injuries. They can happen to people of all ages. There are many things you can do to make your home safe and to help prevent falls. What can I do on the outside of my home? Regularly fix the edges of walkways and driveways and fix any cracks. Remove anything that might make you trip as you walk through a door, such as a raised step or threshold. Trim any bushes or trees on the path to your home. Use bright outdoor lighting. Clear any walking paths of anything that might make someone trip, such as rocks or tools. Regularly check to see if handrails are loose or broken. Make sure that both sides of any steps have handrails. Any raised decks and porches should have guardrails on the edges. Have any leaves, snow, or ice cleared regularly. Use sand or salt on walking paths during winter. Clean up any spills in your garage right away. This includes oil or grease spills. What can I do in the bathroom? Use night lights. Install grab bars by the toilet and in the tub and shower. Do not use towel bars as grab bars. Use non-skid mats or decals in the tub or shower. If you need to sit down in the shower, use a plastic, non-slip stool. Keep the floor dry. Clean up any water that spills on the floor as soon  as it happens. Remove soap buildup in the tub or shower regularly. Attach bath mats securely with double-sided non-slip rug tape. Do not have throw rugs and other things on the floor that can make you trip. What can I do in the bedroom? Use night lights. Make sure that you have a light by your bed that is easy to reach. Do not use any sheets or blankets that are too big for your bed. They should not hang down onto the floor. Have a firm chair that has side arms. You can use this for support while you get dressed. Do not have throw rugs and other things on the floor that can make you trip. What can I do in the kitchen? Clean up any spills right away. Avoid walking on wet floors. Keep items  that you use a lot in easy-to-reach places. If you need to reach something above you, use a strong step stool that has a grab bar. Keep electrical cords out of the way. Do not use floor polish or wax that makes floors slippery. If you must use wax, use non-skid floor wax. Do not have throw rugs and other things on the floor that can make you trip. What can I do with my stairs? Do not leave any items on the stairs. Make sure that there are handrails on both sides of the stairs and use them. Fix handrails that are broken or loose. Make sure that handrails are as long as the stairways. Check any carpeting to make sure that it is firmly attached to the stairs. Fix any carpet that is loose or worn. Avoid having throw rugs at the top or bottom of the stairs. If you do have throw rugs, attach them to the floor with carpet tape. Make sure that you have a light switch at the top of the stairs and the bottom of the stairs. If you do not have them, ask someone to add them for you. What else can I do to help prevent falls? Wear shoes that: Do not have high heels. Have rubber bottoms. Are comfortable and fit you well. Are closed at the toe. Do not wear sandals. If you use a stepladder: Make sure that it is fully opened. Do not climb a closed stepladder. Make sure that both sides of the stepladder are locked into place. Ask someone to hold it for you, if possible. Clearly mark and make sure that you can see: Any grab bars or handrails. First and last steps. Where the edge of each step is. Use tools that help you move around (mobility aids) if they are needed. These include: Canes. Walkers. Scooters. Crutches. Turn on the lights when you go into a dark area. Replace any light bulbs as soon as they burn out. Set up your furniture so you have a clear path. Avoid moving your furniture around. If any of your floors are uneven, fix them. If there are any pets around you, be aware of where they  are. Review your medicines with your doctor. Some medicines can make you feel dizzy. This can increase your chance of falling. Ask your doctor what other things that you can do to help prevent falls. This information is not intended to replace advice given to you by your health care provider. Make sure you discuss any questions you have with your health care provider. Document Released: 01/30/2009 Document Revised: 09/11/2015 Document Reviewed: 05/10/2014 Elsevier Interactive Patient Education  2017 [employer].

## 2022-11-05 NOTE — Patient Instructions (Signed)
Breast Self-Awareness Breast self-awareness is knowing how your breasts look and feel. You need to: Check your breasts on a regular basis. Tell your doctor about any changes. Become familiar with the look and feel of your breasts. This can help you catch a breast problem while it is still small and can be treated. You should do breast self-exams even if you have breast implants. What you need: A mirror. A well-lit room. A pillow or other soft object. How to do a breast self-exam Follow these steps to do a breast self-exam: Look for changes  Take off all the clothes above your waist. Stand in front of a mirror in a room with good lighting. Put your hands down at your sides. Compare your breasts in the mirror. Look for any difference between them, such as: A difference in shape. A difference in size. Wrinkles, dips, and bumps in one breast and not the other. Look at each breast for changes in the skin, such as: Redness. Scaly areas. Skin that has gotten thicker. Dimpling. Open sores (ulcers). Look for changes in your nipples, such as: Fluid coming out of a nipple. Fluid around a nipple. Bleeding. Dimpling. Redness. A nipple that looks pushed in (retracted), or that has changed position. Feel for changes Lie on your back. Feel each breast. To do this: Pick a breast to feel. Place a pillow under the shoulder closest to that breast. Put the arm closest to that breast behind your head. Feel the nipple area of that breast using the hand of your other arm. Feel the area with the pads of your three middle fingers by making small circles with your fingers. Use light, medium, and firm pressure. Continue the overlapping circles, moving downward over the breast. Keep making circles with your fingers. Stop when you feel your ribs. Start making circles with your fingers again, this time going upward until you reach your collarbone. Then, make circles outward across your breast and into your  armpit area. Squeeze your nipple. Check for discharge and lumps. Repeat these steps to check your other breast. Sit or stand in the tub or shower. With soapy water on your skin, feel each breast the same way you did when you were lying down. Write down what you find Writing down what you find can help you remember what to tell your doctor. Write down: What is normal for each breast. Any changes you find in each breast. These include: The kind of changes you find. A tender or painful breast. Any lump you find. Write down its size and where it is. When you last had your monthly period (menstrual cycle). General tips If you are breastfeeding, the best time to check your breasts is after you feed your baby or after you use a breast pump. If you get monthly bleeding, the best time to check your breasts is 5-7 days after your monthly cycle ends. With time, you will become comfortable with the self-exam. You will also start to know if there are changes in your breasts. Contact a doctor if: You see a change in the shape or size of your breasts or nipples. You see a change in the skin of your breast or nipples, such as red or scaly skin. You have fluid coming from your nipples that is not normal. You find a new lump or thick area. You have breast pain. You have any concerns about your breast health. Summary Breast self-awareness includes looking for changes in your breasts and feeling for changes   within your breasts. You should do breast self-awareness in front of a mirror in a well-lit room. If you get monthly periods (menstrual cycles), the best time to check your breasts is 5-7 days after your period ends. Tell your doctor about any changes you see in your breasts. Changes include changes in size, changes on the skin, painful or tender breasts, or fluid from your nipples that is not normal. This information is not intended to replace advice given to you by your health care provider. Make sure  you discuss any questions you have with your health care provider. Document Revised: 09/10/2021 Document Reviewed: 02/05/2021 Elsevier Patient Education  2024 [employer].

## 2022-11-05 NOTE — Progress Notes (Signed)
Subjective:    Patient ID: Sharon Craig, female    DOB: 03/19/1951, 72 y.o.   MRN: [id number]  HPI  Pt presents to the clinic today with c/o changes in the color of her areolas. She noticed this about 3 months ago. She reports sometimes they will look red, and sometimes they look white. She denies any rash, discharge, itching or irritation. Mammogram from 07/2022 reviewed.  Review of Systems  Past Medical History:  Diagnosis Date   Hyperlipidemia    Hypothyroidism     Current Outpatient Medications  Medication Sig Dispense Refill   amLODipine (NORVASC) 5 MG tablet Take 1 tablet (5 mg total) by mouth daily. 90 tablet 3   atorvastatin (LIPITOR) 20 MG tablet Take 1 tablet (20 mg total) by mouth daily. 90 tablet 3   Cholecalciferol (D 1000) 25 MCG (1000 UT) capsule Take by mouth.     cyanocobalamin (VITAMIN B12) 1000 MCG tablet Take by mouth.     donepezil (ARICEPT) 10 MG tablet Take 10 mg by mouth daily.     ferrous sulfate 325 (65 FE) MG tablet Take 325 mg by mouth daily with breakfast.     fluticasone (FLONASE) 50 MCG/ACT nasal spray SHAKE LIQUID AND USE 2 SPRAYS IN EACH NOSTRIL DAILY 48 g 1   hydrochlorothiazide (HYDRODIURIL) 25 MG tablet Take 1 tablet (25 mg total) by mouth daily. 90 tablet 3   levothyroxine (SYNTHROID) 25 MCG tablet Take 1 tablet (25 mcg total) by mouth daily before breakfast. 90 tablet 3   metoprolol succinate (TOPROL-XL) 25 MG 24 hr tablet Take 1 tablet (25 mg total) by mouth daily. 90 tablet 3   mirtazapine (REMERON) 7.5 MG tablet Take 7.5 mg by mouth at bedtime.     sertraline (ZOLOFT) 50 MG tablet Take 1 tablet (50 mg total) by mouth daily. 90 tablet 3   No current facility-administered medications for this visit.    Allergies  Allergen Reactions   Hydrocodone Nausea Only    Family History  Problem Relation Age of Onset   Heart failure Mother    Hypertension Mother    Gout Mother    Hypothyroidism Mother    Heart Problems Sister     Breast cancer Sister    Heart Problems Brother    Dementia Maternal Aunt     Social History   Socioeconomic History   Marital status: Married    Spouse name: Not on file   Number of children: Not on file   Years of education: Not on file   Highest education level: 12th grade  Occupational History   Not on file  Tobacco Use   Smoking status: Never   Smokeless tobacco: Never  Vaping Use   Vaping status: Never Used  Substance and Sexual Activity   Alcohol use: No   Drug use: No   Sexual activity: Not Currently  Other Topics Concern   Not on file  Social History Narrative   Not on file   Social Determinants of Health   Financial Resource Strain: Low Risk  (02/10/2021)   Overall Financial Resource Strain (CARDIA)    Difficulty of Paying Living Expenses: Not hard at all  Food Insecurity: No Food Insecurity (02/10/2021)   Hunger Vital Sign    Worried About Running Out of Food in the Last Year: Never true    Ran Out of Food in the Last Year: Never true  Transportation Needs: No Transportation Needs (02/10/2021)   PRAPARE - Transportation  Lack of Transportation (Medical): No    Lack of Transportation (Non-Medical): No  Physical Activity: Inactive (02/10/2021)   Exercise Vital Sign    Days of Exercise per Week: 0 days    Minutes of Exercise per Session: 0 min  Stress: Stress Concern Present (02/10/2021)   [employer] of Occupational Health - Occupational Stress Questionnaire    Feeling of Stress : To some extent  Social Connections: Moderately Integrated (01/17/2018)   Social Connection and Isolation Panel [NHANES]    Frequency of Communication with Friends and Family: More than three times a week    Frequency of Social Gatherings with Friends and Family: More than three times a week    Attends Religious Services: More than 4 times per year    Active Member of [employer] or Organizations: No    Attends Banker Meetings: Never    Marital Status: Married   Catering manager Violence: Not At Risk (01/17/2018)   Humiliation, Afraid, Rape, and Kick questionnaire    Fear of Current or Ex-Partner: No    Emotionally Abused: No    Physically Abused: No    Sexually Abused: No     Constitutional: Denies fever, malaise, fatigue, headache or abrupt weight changes.  Respiratory: Denies difficulty breathing, shortness of breath, cough or sputum production.   Cardiovascular: Denies chest pain, chest tightness, palpitations or swelling in the hands or feet.  Skin: Pt reports changes in skin of areolas. Denies redness, rashes, lesions or ulcercations.  Neurological: Pt has difficulty with memory. Denies dizziness, difficulty with speech or problems with balance and coordination.    No other specific complaints in a complete review of systems (except as listed in HPI above).     Objective:   Physical Exam   BP 134/72   Pulse 68   Ht 5\' 8"  (1.727 m)   Wt 176 lb (79.8 kg)   BMI 26.76 kg/m  Wt Readings from Last 3 Encounters:  11/05/22 176 lb (79.8 kg)  02/01/22 174 lb 3.2 oz (79 kg)  04/09/21 168 lb 6.4 oz (76.4 kg)    General: Appears her stated age, overweight, in NAD. Breast: Large, symmetrical. No rash noted of the areolas. No discoloration noted. Cardiovascular: Normal rate. Pulmonary/Chest: Normal effort. Neurological: Alert and oriented.   BMET    Component Value Date/Time   NA 143 02/01/2022 1024   NA 139 09/02/2015 1128   K 3.5 02/01/2022 1024   CL 105 02/01/2022 1024   CO2 29 02/01/2022 1024   GLUCOSE 88 02/01/2022 1024   BUN 27 (H) 02/01/2022 1024   BUN 13 09/02/2015 1128   CREATININE 0.84 02/01/2022 1024   CALCIUM 9.7 02/01/2022 1024   GFRNONAA 71 01/24/2020 0926   GFRAA 82 01/24/2020 0926    Lipid Panel     Component Value Date/Time   CHOL 150 02/01/2022 1024   CHOL 162 12/12/2014 1205   TRIG 61 02/01/2022 1024   HDL 53 02/01/2022 1024   HDL 47 12/12/2014 1205   CHOLHDL 2.8 02/01/2022 1024   VLDL 18  12/21/2016 1211   LDLCALC 83 02/01/2022 1024    CBC    Component Value Date/Time   WBC 5.3 02/01/2022 1024   RBC 5.64 (H) 02/01/2022 1024   HGB 12.3 02/01/2022 1024   HGB 12.0 12/12/2014 1205   HCT 39.7 02/01/2022 1024   HCT 37.5 12/12/2014 1205   PLT 183 02/01/2022 1024   PLT 181 12/12/2014 1205   MCV 70.4 (L) 02/01/2022 1024  MCV 67 (L) 12/12/2014 1205   MCH 21.8 (L) 02/01/2022 1024   MCHC 31.0 (L) 02/01/2022 1024   RDW 15.5 (H) 02/01/2022 1024   RDW 16.6 (H) 12/12/2014 1205   LYMPHSABS 1,844 02/01/2022 1024   LYMPHSABS 2.1 12/12/2014 1205   MONOABS 560 12/21/2016 1211   EOSABS 101 02/01/2022 1024   EOSABS 0.1 12/12/2014 1205   BASOSABS 58 02/01/2022 1024   BASOSABS 0.1 12/12/2014 1205    Hgb A1C Lab Results  Component Value Date   HGBA1C 6.0 (H) 02/01/2022           Assessment & Plan:   Breast problem:  Reassurance provided that the skin can change color based on temperature and other factors No concern for inflammatory breast cancer, skin cancer etc  Return precautions discussed Nicki Reaper, NP

## 2022-11-05 NOTE — Progress Notes (Signed)
Subjective:   Sharon Craig is a 72 y.o. female who presents for Medicare Annual (Subsequent) preventive examination.  Visit Complete: In person   Review of Systems     Cardiac Risk Factors include: advanced age (>35men, >4 women);dyslipidemia;hypertension;sedentary lifestyle     Objective:    Today's Vitals   11/05/22 1340  BP: 132/60  Weight: 179 lb 3.2 oz (81.3 kg)  Height: 5\' 9"  (1.753 m)   Body mass index is 26.46 kg/m.     11/05/2022    1:48 PM 02/10/2021    8:23 AM 01/23/2019   11:15 AM 01/23/2019   11:12 AM 01/17/2018   10:31 AM 12/21/2016   11:02 AM  Advanced Directives  Does Patient Have a Medical Advance Directive? No No No No No No  Would patient like information on creating a medical advance directive? No - Patient declined    Yes (MAU/Ambulatory/Procedural Areas - Information given) Yes (MAU/Ambulatory/Procedural Areas - Information given)    Current Medications (verified) Outpatient Encounter Medications as of 11/05/2022  Medication Sig   amLODipine (NORVASC) 5 MG tablet Take 1 tablet (5 mg total) by mouth daily.   atorvastatin (LIPITOR) 20 MG tablet Take 1 tablet (20 mg total) by mouth daily.   Cholecalciferol (D 1000) 25 MCG (1000 UT) capsule Take by mouth.   cyanocobalamin (VITAMIN B12) 1000 MCG tablet Take by mouth.   donepezil (ARICEPT) 10 MG tablet Take 10 mg by mouth daily.   ferrous sulfate 325 (65 FE) MG tablet Take 325 mg by mouth daily with breakfast.   fluticasone (FLONASE) 50 MCG/ACT nasal spray SHAKE LIQUID AND USE 2 SPRAYS IN EACH NOSTRIL DAILY   hydrochlorothiazide (HYDRODIURIL) 25 MG tablet Take 1 tablet (25 mg total) by mouth daily.   levothyroxine (SYNTHROID) 25 MCG tablet Take 1 tablet (25 mcg total) by mouth daily before breakfast.   metoprolol succinate (TOPROL-XL) 25 MG 24 hr tablet Take 1 tablet (25 mg total) by mouth daily.   mirtazapine (REMERON) 7.5 MG tablet Take 7.5 mg by mouth at bedtime.   sertraline (ZOLOFT) 50 MG  tablet Take 1 tablet (50 mg total) by mouth daily.   No facility-administered encounter medications on file as of 11/05/2022.    Allergies (verified) Hydrocodone   History: Past Medical History:  Diagnosis Date   Hyperlipidemia    Hypothyroidism    Past Surgical History:  Procedure Laterality Date   ABDOMINAL HYSTERECTOMY     COLONOSCOPY WITH PROPOFOL N/A 12/05/2014   Procedure: COLONOSCOPY WITH PROPOFOL;  Surgeon: Wallace Cullens, MD;  Location: [facility] ENDOSCOPY;  Service: Gastroenterology;  Laterality: N/A;   KNEE SURGERY Left    TUBAL LIGATION     Family History  Problem Relation Age of Onset   Heart failure Mother    Hypertension Mother    Gout Mother    Hypothyroidism Mother    Heart Problems Sister    Breast cancer Sister    Heart Problems Brother    Dementia Maternal Aunt    Social History   Socioeconomic History   Marital status: Married    Spouse name: Not on file   Number of children: Not on file   Years of education: Not on file   Highest education level: 12th grade  Occupational History   Not on file  Tobacco Use   Smoking status: Never   Smokeless tobacco: Never  Vaping Use   Vaping status: Never Used  Substance and Sexual Activity   Alcohol use: No  Drug use: No   Sexual activity: Not Currently  Other Topics Concern   Not on file  Social History Narrative   Not on file   Social Determinants of Health   Financial Resource Strain: Low Risk  (11/05/2022)   Overall Financial Resource Strain (CARDIA)    Difficulty of Paying Living Expenses: Not hard at all  Food Insecurity: No Food Insecurity (11/05/2022)   Hunger Vital Sign    Worried About Running Out of Food in the Last Year: Never true    Ran Out of Food in the Last Year: Never true  Transportation Needs: No Transportation Needs (11/05/2022)   PRAPARE - Administrator, Civil Service (Medical): No    Lack of Transportation (Non-Medical): No  Physical Activity: Insufficiently Active  (11/05/2022)   Exercise Vital Sign    Days of Exercise per Week: 3 days    Minutes of Exercise per Session: 30 min  Stress: No Stress Concern Present (11/05/2022)   [employer] of Occupational Health - Occupational Stress Questionnaire    Feeling of Stress : Only a little  Social Connections: Moderately Integrated (11/05/2022)   Social Connection and Isolation Panel [NHANES]    Frequency of Communication with Friends and Family: More than three times a week    Frequency of Social Gatherings with Friends and Family: Three times a week    Attends Religious Services: More than 4 times per year    Active Member of Clubs or Organizations: No    Attends Banker Meetings: Never    Marital Status: Married    Tobacco Counseling Counseling given: Not Answered   Clinical Intake:  Pre-visit preparation completed: Yes  Pain : No/denies pain     Nutritional Risks: None Diabetes: No  How often do you need to have someone help you when you read instructions, pamphlets, or other written materials from your doctor or pharmacy?: 1 - Never  Interpreter Needed?: No  Information entered by :: Kennedy Bucker, LPN   Activities of Daily Living    11/05/2022    1:49 PM  In your present state of health, do you have any difficulty performing the following activities:  Hearing? 0  Vision? 0  Difficulty concentrating or making decisions? 0  Walking or climbing stairs? 0  Dressing or bathing? 0  Doing errands, shopping? 0  Preparing Food and eating ? N  Using the Toilet? N  In the past six months, have you accidently leaked urine? N  Do you have problems with loss of bowel control? N  Managing your Medications? N  Managing your Finances? N  Housekeeping or managing your Housekeeping? N    Patient Care Team: Smitty Cords, DO as PCP - General (Family Medicine)  Indicate any recent Medical Services you may have received from other than [facility] providers in the past  year (date may be approximate).     Assessment:   This is a routine wellness examination for Sharon Craig.  Hearing/Vision screen Hearing Screening - Comments:: No aids Vision Screening - Comments:: Readers- Dr.Woodard  Dietary issues and exercise activities discussed:     Goals Addressed             This Visit's Progress    DIET - EAT MORE FRUITS AND VEGETABLES         Depression Screen    11/05/2022    1:45 PM 02/01/2022    9:37 AM 02/10/2021    8:24 AM 12/05/2020    2:01 PM  06/13/2020   11:37 AM 01/24/2020    9:05 AM 12/14/2019   11:43 AM  PHQ 2/9 Scores  PHQ - 2 Score 0 1 0 2 5 4 3   PHQ- 9 Score 0 3  2 5 8 8     Fall Risk    11/05/2022    1:49 PM 02/01/2022    9:37 AM 02/10/2021    8:24 AM 08/10/2019    9:48 AM 02/09/2019    8:44 AM  Fall Risk   Falls in the past year? 0 0 0 0 0  Number falls in past yr: 0 0  0   Injury with Fall? 0 0  0   Risk for fall due to : No Fall Risks No Fall Risks Medication side effect    Follow up Falls prevention discussed;Falls evaluation completed Falls evaluation completed Falls evaluation completed;Education provided;Falls prevention discussed Falls evaluation completed Falls evaluation completed    MEDICARE RISK AT HOME:  Medicare Risk at Home - 11/05/22 1349     Any stairs in or around the home? No    If so, are there any without handrails? No    Home free of loose throw rugs in walkways, pet beds, electrical cords, etc? Yes    Adequate lighting in your home to reduce risk of falls? Yes    Life alert? No    Use of a cane, walker or w/c? No    Grab bars in the bathroom? No    Shower chair or bench in shower? No    Elevated toilet seat or a handicapped toilet? No             TIMED UP AND GO:  Was the test performed?  Yes  Length of time to ambulate 10 feet: 4 sec Gait steady and fast without use of assistive device    Cognitive Function:        11/05/2022    1:50 PM 02/10/2021    8:26 AM 12/14/2019   11:26 AM  01/17/2018   10:34 AM 12/21/2016   11:05 AM  6CIT Screen  What Year? 0 points 0 points 0 points 0 points 0 points  What month? 0 points 0 points 0 points 0 points 0 points  What time? 0 points 0 points 0 points 0 points 0 points  Count back from 20 0 points 0 points 0 points 0 points 0 points  Months in reverse 0 points 2 points 0 points 0 points 0 points  Repeat phrase 10 points 0 points 2 points 2 points 0 points  Total Score 10 points 2 points 2 points 2 points 0 points    Immunizations Immunization History  Administered Date(s) Administered   Fluad Quad(high Dose 65+) 02/09/2019, 01/24/2020   Influenza, High Dose Seasonal PF 03/17/2015, 12/21/2016, 04/27/2018   Moderna Sars-Covid-2 Vaccination 11/04/2019, 12/02/2019   PNEUMOCOCCAL CONJUGATE-20 02/01/2022   Pneumococcal Polysaccharide-23 12/21/2016    TDAP status: Due, Education has been provided regarding the importance of this vaccine. Advised may receive this vaccine at local pharmacy or Health Dept. Aware to provide a copy of the vaccination record if obtained from local pharmacy or Health Dept. Verbalized acceptance and understanding.  Flu Vaccine status: Declined, Education has been provided regarding the importance of this vaccine but patient still declined. Advised may receive this vaccine at local pharmacy or Health Dept. Aware to provide a copy of the vaccination record if obtained from local pharmacy or Health Dept. Verbalized acceptance and understanding.  Pneumococcal vaccine status:  Up to date  Covid-19 vaccine status: Completed vaccines  Qualifies for Shingles Vaccine? Yes   Zostavax completed No   Shingrix Completed?: No.    Education has been provided regarding the importance of this vaccine. Patient has been advised to call insurance company to determine out of pocket expense if they have not yet received this vaccine. Advised may also receive vaccine at local pharmacy or Health Dept. Verbalized acceptance and  understanding.  Screening Tests Health Maintenance  Topic Date Due   DTaP/Tdap/Td (1 - Tdap) Never done   Zoster Vaccines- Shingrix (1 of 2) Never done   COVID-19 Vaccine (3 - Moderna risk series) 12/30/2019   INFLUENZA VACCINE  11/18/2022   Medicare Annual Wellness (AWV)  11/05/2023   MAMMOGRAM  08/12/2024   Colonoscopy  12/04/2024   Pneumonia Vaccine 40+ Years old  Completed   DEXA SCAN  Completed   Hepatitis C Screening  Completed   HPV VACCINES  Aged Out    Health Maintenance  Health Maintenance Due  Topic Date Due   DTaP/Tdap/Td (1 - Tdap) Never done   Zoster Vaccines- Shingrix (1 of 2) Never done   COVID-19 Vaccine (3 - Moderna risk series) 12/30/2019    Colorectal cancer screening: Type of screening: Colonoscopy. Completed 12/05/14. Repeat every 10 years  Mammogram status: Completed 08/13/22. Repeat every year  Bone Density status: Completed 12/12/14. Results reflect: Bone density results: NORMAL. Repeat every 5 years.  Lung Cancer Screening: (Low Dose CT Chest recommended if Age 30-80 years, 20 pack-year currently smoking OR have quit w/in 15years.) does not qualify.   Additional Screening:  Hepatitis C Screening: does qualify; Completed 12/21/16  Vision Screening: Recommended annual ophthalmology exams for early detection of glaucoma and other disorders of the eye. Is the patient up to date with their annual eye exam?  Yes  Who is the provider or what is the name of the office in which the patient attends annual eye exams? Dr.Woodard  If pt is not established with a provider, would they like to be referred to a provider to establish care? No .   Dental Screening: Recommended annual dental exams for proper oral hygiene  Community Resource Referral / Chronic Care Management: CRR required this visit?  No   CCM required this visit?  No     Plan:     I have personally reviewed and noted the following in the patient's chart:   Medical and social history Use of  alcohol, tobacco or illicit drugs  Current medications and supplements including opioid prescriptions. Patient is not currently taking opioid prescriptions. Functional ability and status Nutritional status Physical activity Advanced directives List of other physicians Hospitalizations, surgeries, and ER visits in previous 12 months Vitals Screenings to include cognitive, depression, and falls Referrals and appointments  In addition, I have reviewed and discussed with patient certain preventive protocols, quality metrics, and best practice recommendations. A written personalized care plan for preventive services as well as general preventive health recommendations were provided to patient.     Hal Hope, LPN   3/[id number]/[id number]    Nurse Notes: none

## 2022-12-22 ENCOUNTER — Other Ambulatory Visit: Payer: Self-pay | Admitting: Physician Assistant

## 2022-12-22 DIAGNOSIS — F4489 Other dissociative and conversion disorders: Secondary | ICD-10-CM

## 2022-12-22 DIAGNOSIS — R413 Other amnesia: Secondary | ICD-10-CM

## 2022-12-27 ENCOUNTER — Ambulatory Visit
Admission: RE | Admit: 2022-12-27 | Discharge: 2022-12-27 | Disposition: A | Discharge status: Home / Self Care | Payer: Medicare Other | Source: Ambulatory Visit | Attending: Physician Assistant | Admitting: Physician Assistant

## 2022-12-27 DIAGNOSIS — R413 Other amnesia: Secondary | ICD-10-CM | POA: Insufficient documentation

## 2022-12-27 DIAGNOSIS — F4489 Other dissociative and conversion disorders: Secondary | ICD-10-CM | POA: Diagnosis present

## 2022-12-29 ENCOUNTER — Telehealth: Payer: Self-pay | Admitting: Family Medicine

## 2022-12-29 DIAGNOSIS — N3001 Acute cystitis with hematuria: Secondary | ICD-10-CM

## 2022-12-29 MED ORDER — CEPHALEXIN 500 MG PO CAPS
500.0000 mg | ORAL_CAPSULE | Freq: Three times a day (TID) | ORAL | 0 refills | Status: DC
Start: 2022-12-29 — End: 2023-05-17

## 2022-12-29 NOTE — Telephone Encounter (Signed)
Please notify them. I attempted to call patient's daughter in law Tiffany. Did not reach her.  I will agree to send antibiotic Keflex based on the urine testing results I reviewed on chart 12/27/22 per Neurology.  Take 1 capsule 3 times a day for 7 days.  Rx Keflex sent to [facility]  If symptoms do not resolve, we can follow-up and repeat urine testing and culture.  Saralyn Pilar, DO [facility] 12/29/2022, 12:52 PM

## 2022-12-29 NOTE — Telephone Encounter (Signed)
I spoke to [facility] and relayed message that antibiotics have been called in. I advised the name of medication and instructions for taking medicaion.   Sharon Craig reports concerns with cognitive decline and memory. She states for this UTI, her only symptom was delirium. She does state there are more problem with memory outside of this event.

## 2022-12-29 NOTE — Telephone Encounter (Signed)
Daughter Tiffany calling b/c pt had labs done at [facility] clinic by her neurologist.  Elmarie Shiley says they told her to call her pcp for an abx. I told pt e don't usually result other office's or dr's labs.  She said "ell if you look at her labs, you can surely see she has a UTI".   I spoke w/ my NT, ho advised send Dr Kirtland Bouchard this message. Pt wants Dr Kirtland Bouchard to prescribe an abx.  Tiffany said they did not want to.

## 2022-12-29 NOTE — Telephone Encounter (Signed)
I understand. I expect that is something that the Neurology team will work on them with as the cognitive decline and memory is the primary reason we consulted them.  I am happy to keep following up at next visit and review this further. But the original question was about acute UTI treatment that was requested by Neurology.  Thank you!  Saralyn Pilar, DO [facility] 12/29/2022, 3:21 PM

## 2022-12-30 ENCOUNTER — Other Ambulatory Visit: Payer: Self-pay | Admitting: Family Medicine

## 2022-12-30 DIAGNOSIS — I10 Essential (primary) hypertension: Secondary | ICD-10-CM

## 2022-12-30 NOTE — Telephone Encounter (Signed)
Requested Prescriptions  Refused Prescriptions Disp Refills   metoprolol succinate (TOPROL-XL) 25 MG 24 hr tablet [Pharmacy Med Name: METOPROLOL ER SUCCINATE 25MG  TABS] 90 tablet 3    Sig: TAKE 1 TABLET(25 MG) BY MOUTH DAILY     Cardiovascular:  Beta Blockers Passed - 12/30/2022 12:08 PM      Passed - Last BP in normal range    BP Readings from Last 1 Encounters:  11/05/22 134/72         Passed - Last Heart Rate in normal range    Pulse Readings from Last 1 Encounters:  11/05/22 68         Passed - Valid encounter within last 6 months    Recent Outpatient Visits           1 month ago Breast complaint   [place] [facility] [place], Salvadore Oxford, NP   11 months ago Annual physical exam   [place] [facility] Smitty Cords, DO   1 year ago Mild cognitive impairment with memory loss   [facility] Smitty Cords, DO   2 years ago Annual physical exam   [place] [facility] Smitty Cords, DO   2 years ago Major depressive disorder, recurrent, moderate [facility])   [place] [facility] [place], [place]ta [place], [place]

## 2023-01-07 ENCOUNTER — Encounter: Payer: Self-pay | Admitting: Family Medicine

## 2023-01-07 DIAGNOSIS — T3695XA Adverse effect of unspecified systemic antibiotic, initial encounter: Secondary | ICD-10-CM

## 2023-01-11 ENCOUNTER — Ambulatory Visit: Payer: Self-pay | Admitting: *Deleted

## 2023-01-11 MED ORDER — FLUCONAZOLE 150 MG PO TABS
ORAL_TABLET | ORAL | 0 refills | Status: DC
Start: 2023-01-11 — End: 2023-01-13

## 2023-01-11 NOTE — Telephone Encounter (Signed)
I sent rx Diflucan to [facility].  I was able to go into her chart to find the MyChart message. It was still in clinical pool work queue. We have had an extremely high volume of mychart messages and her message was received on Friday 9/20 at 4:37pm. I responded on the mychart message and explained the timeline for [place] and expectations

## 2023-01-11 NOTE — Telephone Encounter (Signed)
Summary: Yeast infection symptoms   Per agent: "Sharon Craig called regarding the patient's yeast infection symptoms, she sent a message on Friday and is upset that there has not been any response."  ----- Message from Dondra Prader E sent at 01/11/2023  9:28 AM EDT ----- Sharon Craig called regarding the patient's yeast infection symptoms, she sent a message on Friday and is upset that there has not been any response.          Chief Complaint: Vaginal burning, itching Symptoms: Vaginal discharge, burning, itching S/P ATBs. Frequency: Message sent Friday Pertinent Negatives: Patient denies  Disposition: [] ED /[] Urgent Care (no appt availability in office) / [] Appointment(In office/virtual)/ []  [facility]/ [] Home Care/ [] Refused Recommended Disposition /[] [place]/ [x]  Follow-up with PCP Additional Notes:  Triage incomplete, unable to speak to pt. 'Sharon Craig' left message Friday regarding issue. Requesting Flagyl be sent in to [facility] in [place]. Expresses frustration no response. Sharon Craig is not on DPR. States "I have been in there 3 times to do that."  Attempted service recovery.  Would like a call back. [phone]  Reason for Disposition  Symptoms of a vaginal yeast infection (i.e., white, thick, cottage-cheese-like, itchy, not bad smelling discharge)  Answer Assessment - Initial Assessment Questions 1. DISCHARGE: "Describe the discharge." (e.g., white, yellow, green, gray, foamy, cottage cheese-like)     Yes 2. ODOR: "Is there a bad odor?"     Unsure 3. ONSET: "When did the discharge begin?"     Message sent Friday 4. RASH: "Is there a rash in the genital area?" If Yes, ask: "Describe it." (e.g., redness, blisters, sores, bumps)      Irritation 5. ABDOMEN PAIN: "Are you having any abdomen pain?" If Yes, ask: "What does it feel like? " (e.g., crampy, dull, intermittent, constant)       6. ABDOMEN PAIN SEVERITY: If present, ask: "How bad is it?" (e.g., Scale 1-10;  mild, moderate, or severe)   - MILD (1-3): Doesn't interfere with normal activities, abdomen soft and not tender to touch.    - MODERATE (4-7): Interferes with normal activities or awakens from sleep, abdomen tender to touch.    - SEVERE (8-10): Excruciating pain, doubled over, unable to do any normal activities. (R/O peritonitis)       7. CAUSE: "What do you think is causing the discharge?" "Have you had the same problem before? What happened then?"     ATBs 8. OTHER SYMPTOMS: "Do you have any other symptoms?" (e.g., fever, itching, vaginal bleeding, pain with urination, injury to genital area, vaginal foreign body)     Itching and burning  Protocols used: Vaginal Discharge-A-AH

## 2023-01-13 ENCOUNTER — Telehealth: Payer: Self-pay | Admitting: Family Medicine

## 2023-01-13 DIAGNOSIS — B379 Candidiasis, unspecified: Secondary | ICD-10-CM

## 2023-01-13 MED ORDER — FLUCONAZOLE 150 MG PO TABS
ORAL_TABLET | ORAL | 0 refills | Status: DC
Start: 2023-01-13 — End: 2023-05-17

## 2023-01-13 NOTE — Telephone Encounter (Signed)
Okay I will re-send DIflucan for 1 pill  Saralyn Pilar, DO [facility] 01/13/2023, 4:35 PM

## 2023-01-13 NOTE — Telephone Encounter (Signed)
Pt is calling in because she was prescribed fluconazole (DIFLUCAN) 150 MG tablet [[id number]] . Pt says the pills came on two cards, one pill on each card and she believes she threw one of the tablets away accidentally because she can't find it. Pt wants to know if she can have one pill resent to the pharmacy. Please advise.

## 2023-01-27 ENCOUNTER — Other Ambulatory Visit: Payer: Medicare Other

## 2023-02-11 DIAGNOSIS — H35341 Macular cyst, hole, or pseudohole, right eye: Secondary | ICD-10-CM | POA: Diagnosis not present

## 2023-02-11 DIAGNOSIS — H1045 Other chronic allergic conjunctivitis: Secondary | ICD-10-CM | POA: Diagnosis not present

## 2023-02-11 DIAGNOSIS — H2513 Age-related nuclear cataract, bilateral: Secondary | ICD-10-CM | POA: Diagnosis not present

## 2023-02-11 DIAGNOSIS — H43811 Vitreous degeneration, right eye: Secondary | ICD-10-CM | POA: Diagnosis not present

## 2023-02-13 ENCOUNTER — Other Ambulatory Visit: Payer: Self-pay | Admitting: Family Medicine

## 2023-02-13 DIAGNOSIS — E785 Hyperlipidemia, unspecified: Secondary | ICD-10-CM

## 2023-02-14 NOTE — Telephone Encounter (Signed)
Requested medications are due for refill today.  Yes  Requested medications are on the active medications list.  yes  Last refill. 02/01/2022 #90 3 rf  Future visit scheduled.   no  Notes to clinic.  Labs are expired.    Requested Prescriptions  Pending Prescriptions Disp Refills   atorvastatin (LIPITOR) 20 MG tablet [Pharmacy Med Name: ATORVASTATIN 20MG  TABLETS] 90 tablet 3    Sig: TAKE 1 TABLET(20 MG) BY MOUTH DAILY     Cardiovascular:  Antilipid - Statins Failed - 02/13/2023  7:07 AM      Failed - Lipid Panel in normal range within the last 12 months    Cholesterol, Total  Date Value Ref Range Status  12/12/2014 162 100 - 199 mg/dL Final   Cholesterol  Date Value Ref Range Status  02/01/2022 150 <200 mg/dL Final   LDL Cholesterol (Calc)  Date Value Ref Range Status  02/01/2022 83 mg/dL (calc) Final    Comment:    Reference range: <100 . Desirable range <100 mg/dL for primary prevention;   <70 mg/dL for patients with CHD or diabetic patients  with > or = 2 CHD risk factors. Marland Kitchen LDL-C is now calculated using the Martin-Hopkins  calculation, which is a validated novel method providing  better accuracy than the Friedewald equation in the  estimation of LDL-C.  Horald Pollen et al. Lenox Ahr. 0254;270(62): 2061-2068  (http://education.[employer].com/faq/FAQ164)    HDL  Date Value Ref Range Status  02/01/2022 53 > OR = 50 mg/dL Final  37/62/8315 47 >17 mg/dL Final    Comment:    According to ATP-III Guidelines, HDL-C >59 mg/dL is considered a negative risk factor for CHD.    Triglycerides  Date Value Ref Range Status  02/01/2022 61 <150 mg/dL Final         Passed - Patient is not pregnant      Passed - Valid encounter within last 12 months    Recent Outpatient Visits           3 months ago Breast complaint   [facility] [facility] [place], Salvadore Oxford, NP   1 year ago Annual physical exam   [facility] Smitty Cords, DO   1 year ago Mild cognitive impairment with memory loss   [facility] Smitty Cords, DO   2 years ago Annual physical exam   [place] [facility] Smitty Cords, DO   2 years ago Major depressive disorder, recurrent, moderate [facility])   [facility] [place], [place], [place]

## 2023-03-25 ENCOUNTER — Other Ambulatory Visit: Payer: Self-pay | Admitting: Family Medicine

## 2023-03-25 DIAGNOSIS — I10 Essential (primary) hypertension: Secondary | ICD-10-CM

## 2023-03-28 NOTE — Telephone Encounter (Signed)
Requested medication (s) are due for refill today: yes  Requested medication (s) are on the active medication list: yes  Last refill:  01/22/22  Future visit scheduled: no   Notes to clinic:  called pt to make appt and no answer- will send MyChart message   Requested Prescriptions  Pending Prescriptions Disp Refills   hydrochlorothiazide (HYDRODIURIL) 25 MG tablet [Pharmacy Med Name: HYDROCHLOROTHIAZIDE 25MG  TABLETS] 90 tablet 3    Sig: TAKE 1 TABLET(25 MG) BY MOUTH DAILY     Cardiovascular: Diuretics - Thiazide Failed - 03/25/2023  9:36 PM      Failed - Cr in normal range and within 180 days    Creat  Date Value Ref Range Status  02/01/2022 0.84 0.60 - 1.00 mg/dL Final         Failed - K in normal range and within 180 days    Potassium  Date Value Ref Range Status  02/01/2022 3.5 3.5 - 5.3 mmol/L Final         Failed - Na in normal range and within 180 days    Sodium  Date Value Ref Range Status  02/01/2022 143 135 - 146 mmol/L Final  09/02/2015 139 134 - 144 mmol/L Final         Passed - Last BP in normal range    BP Readings from Last 1 Encounters:  11/05/22 134/72         Passed - Valid encounter within last 6 months    Recent Outpatient Visits           4 months ago Breast complaint   [place] [facility] [place], Salvadore Oxford, NP   1 year ago Annual physical exam   [place] [facility] Smitty Cords, DO   1 year ago Mild cognitive impairment with memory loss   [facility] Smitty Cords, DO   2 years ago Annual physical exam   [place] [facility] Smitty Cords, DO   2 years ago Major depressive disorder, recurrent, moderate [facility])    [facility] [place], [place], [place]

## 2023-03-28 NOTE — Telephone Encounter (Signed)
Needs appt

## 2023-03-28 NOTE — Telephone Encounter (Signed)
Requested medications are due for refill today.  yes  Requested medications are on the active medications list.  yes  Last refill. 02/01/2022 #90 3 rf  Future visit scheduled.   no  Notes to clinic.  Labs are expired.    Requested Prescriptions  Pending Prescriptions Disp Refills   hydrochlorothiazide (HYDRODIURIL) 25 MG tablet [Pharmacy Med Name: HYDROCHLOROTHIAZIDE 25MG  TABLETS] 90 tablet 3    Sig: TAKE 1 TABLET(25 MG) BY MOUTH DAILY     Cardiovascular: Diuretics - Thiazide Failed - 03/25/2023  3:34 PM      Failed - Cr in normal range and within 180 days    Creat  Date Value Ref Range Status  02/01/2022 0.84 0.60 - 1.00 mg/dL Final         Failed - K in normal range and within 180 days    Potassium  Date Value Ref Range Status  02/01/2022 3.5 3.5 - 5.3 mmol/L Final         Failed - Na in normal range and within 180 days    Sodium  Date Value Ref Range Status  02/01/2022 143 135 - 146 mmol/L Final  09/02/2015 139 134 - 144 mmol/L Final         Passed - Last BP in normal range    BP Readings from Last 1 Encounters:  11/05/22 134/72         Passed - Valid encounter within last 6 months    Recent Outpatient Visits           4 months ago Breast complaint   [place] [facility], Salvadore Oxford, NP   1 year ago Annual physical exam   [place] [facility] Smitty Cords, DO   1 year ago Mild cognitive impairment with memory loss   [facility] Smitty Cords, DO   2 years ago Annual physical exam   [place] [facility] Smitty Cords, DO   2 years ago Major depressive disorder, recurrent, moderate [facility])   [place] [facility] [place], Netta Neat, [place]

## 2023-04-14 ENCOUNTER — Other Ambulatory Visit: Payer: Self-pay | Admitting: Family Medicine

## 2023-04-14 DIAGNOSIS — I10 Essential (primary) hypertension: Secondary | ICD-10-CM

## 2023-04-15 NOTE — Telephone Encounter (Signed)
Needs appointment

## 2023-04-15 NOTE — Telephone Encounter (Signed)
Requested medication (s) are due for refill today: Yes  Requested medication (s) are on the active medication list: Yes  Last refill:  02/01/22  Future visit scheduled: No  Notes to clinic:  Prescription has expired.    Requested Prescriptions  Pending Prescriptions Disp Refills   metoprolol succinate (TOPROL-XL) 25 MG 24 hr tablet [Pharmacy Med Name: METOPROLOL ER SUCCINATE 25MG  TABS] 90 tablet 3    Sig: TAKE 1 TABLET(25 MG) BY MOUTH DAILY     Cardiovascular:  Beta Blockers Passed - 04/15/2023  2:59 PM      Passed - Last BP in normal range    BP Readings from Last 1 Encounters:  11/05/22 134/72         Passed - Last Heart Rate in normal range    Pulse Readings from Last 1 Encounters:  11/05/22 68         Passed - Valid encounter within last 6 months    Recent Outpatient Visits           5 months ago Breast complaint   [place] [facility] [place], Salvadore Oxford, NP   1 year ago Annual physical exam   [place] [facility] [place], Netta Neat, DO   2 years ago Mild cognitive impairment with memory loss   [facility] Smitty Cords, DO   2 years ago Annual physical exam   [place] [facility] Smitty Cords, DO   2 years ago Major depressive disorder, recurrent, moderate [facility])   [place] [facility] [place], Netta Neat, [place]

## 2023-04-27 ENCOUNTER — Ambulatory Visit: Payer: Self-pay

## 2023-04-27 NOTE — Telephone Encounter (Signed)
 Chief Complaint: Medication Question   Disposition: [] ED /[] Urgent Care (no appt availability in office) / [] Appointment(In office/virtual)/ []  [place] [facility]/ [x] Home Care/ [] Refused Recommended Disposition /[] [facility]/ []  Follow-up with PCP Additional Notes: Patient wanting to know if she should continue taking prescribed medication because she feels fine. Care advice given and it was noted that patient has not been seen by PCP since 2023. Patient has been scheduled for a CPE with PCP at first available appointment on 05/17/23. Advised patient PCP to evaluate and determine what medication is still needed at that time. Patient verbalized understanding.   Summary: Question regarding medications, does she need to keep taking them   Patient Doolen would like a call regarding her medications. She would like to ask if she needs to continue taking them...    Levothyroxine  (SYNTHROID ) 25 MCG tablet and   Metoprolol  succinate (TOPROL -XL) 25 MG 24 hr tablet      and   Atorvastatin  (LIPITOR) 20 MG tablet [[id number]     Reason for Disposition  Caller has medicine question only, adult not sick, AND triager answers question  Answer Assessment - Initial Assessment Questions 1. NAME of MEDICINE: What medicine(s) are you calling about?     Levothyroxine  (SYNTHROID ) 25 MCG tablet,   Metoprolol  succinate (TOPROL -XL) 25 MG 24 hr tablet      and   Atorvastatin  (LIPITOR) 20 MG tablet 2. QUESTION: What is your question? (e.g., double dose of medicine, side effect)     Do I need to continue taking these medications? 3. PRESCRIBER: Who prescribed the medicine? Reason: if prescribed by specialist, call should be referred to that group.     Dr. edman 4. SYMPTOMS: Do you have any symptoms? If Yes, ask: What symptoms are you having?  How bad are the symptoms (e.g., mild, moderate, severe)     None  Protocols used: Medication Question Call-A-AH

## 2023-05-08 LAB — HEMOGLOBIN A1C: Hemoglobin A1C: 5.7

## 2023-05-15 ENCOUNTER — Other Ambulatory Visit: Payer: Self-pay | Admitting: Family Medicine

## 2023-05-15 DIAGNOSIS — E039 Hypothyroidism, unspecified: Secondary | ICD-10-CM

## 2023-05-17 ENCOUNTER — Ambulatory Visit (INDEPENDENT_AMBULATORY_CARE_PROVIDER_SITE_OTHER): Payer: Medicare Other | Admitting: Family Medicine

## 2023-05-17 VITALS — BP 134/60 | HR 59 | Ht 69.0 in | Wt 165.0 lb

## 2023-05-17 DIAGNOSIS — E559 Vitamin D deficiency, unspecified: Secondary | ICD-10-CM | POA: Diagnosis not present

## 2023-05-17 DIAGNOSIS — Z0000 Encounter for general adult medical examination without abnormal findings: Secondary | ICD-10-CM

## 2023-05-17 DIAGNOSIS — R413 Other amnesia: Secondary | ICD-10-CM | POA: Diagnosis not present

## 2023-05-17 DIAGNOSIS — Z1231 Encounter for screening mammogram for malignant neoplasm of breast: Secondary | ICD-10-CM

## 2023-05-17 DIAGNOSIS — R7309 Other abnormal glucose: Secondary | ICD-10-CM | POA: Diagnosis not present

## 2023-05-17 DIAGNOSIS — E538 Deficiency of other specified B group vitamins: Secondary | ICD-10-CM

## 2023-05-17 DIAGNOSIS — I10 Essential (primary) hypertension: Secondary | ICD-10-CM | POA: Diagnosis not present

## 2023-05-17 DIAGNOSIS — M150 Primary generalized (osteo)arthritis: Secondary | ICD-10-CM | POA: Diagnosis not present

## 2023-05-17 DIAGNOSIS — Z23 Encounter for immunization: Secondary | ICD-10-CM

## 2023-05-17 DIAGNOSIS — F411 Generalized anxiety disorder: Secondary | ICD-10-CM

## 2023-05-17 DIAGNOSIS — E039 Hypothyroidism, unspecified: Secondary | ICD-10-CM

## 2023-05-17 DIAGNOSIS — Z636 Dependent relative needing care at home: Secondary | ICD-10-CM

## 2023-05-17 DIAGNOSIS — E785 Hyperlipidemia, unspecified: Secondary | ICD-10-CM

## 2023-05-17 DIAGNOSIS — F331 Major depressive disorder, recurrent, moderate: Secondary | ICD-10-CM

## 2023-05-17 DIAGNOSIS — F5104 Psychophysiologic insomnia: Secondary | ICD-10-CM

## 2023-05-17 MED ORDER — HYDROCHLOROTHIAZIDE 25 MG PO TABS
25.0000 mg | ORAL_TABLET | Freq: Every day | ORAL | 3 refills | Status: AC
Start: 2023-05-17 — End: ?

## 2023-05-17 MED ORDER — SERTRALINE HCL 50 MG PO TABS
50.0000 mg | ORAL_TABLET | Freq: Every day | ORAL | 3 refills | Status: AC
Start: 2023-05-17 — End: ?

## 2023-05-17 MED ORDER — ATORVASTATIN CALCIUM 20 MG PO TABS: 20.0000 mg | ORAL_TABLET | Freq: Every day | ORAL | 3 refills | Status: DC

## 2023-05-17 MED ORDER — LEVOTHYROXINE SODIUM 25 MCG PO TABS
25.0000 ug | ORAL_TABLET | Freq: Every day | ORAL | 3 refills | Status: AC
Start: 2023-05-17 — End: ?

## 2023-05-17 MED ORDER — AMLODIPINE BESYLATE 5 MG PO TABS: 5.0000 mg | ORAL_TABLET | Freq: Every day | ORAL | 3 refills | Status: AC

## 2023-05-17 MED ORDER — MIRTAZAPINE 7.5 MG PO TABS
7.5000 mg | ORAL_TABLET | Freq: Every day | ORAL | 2 refills | Status: DC
Start: 2023-05-17 — End: 2023-09-21

## 2023-05-17 NOTE — Patient Instructions (Addendum)
Thank you for coming to the office today.  Recent Labs    05/08/23 0000  HGBA1C 5.7   Good job with the blood sugar. Keep improving diet. This is in Pre Diabetes range.  Rest of lab work due in about 1 month, make sure you are taking all medicines properly.  BRING ALL MEDICATION BOTTLES TO NEXT VISIT  Restart sleeping pill Mirtazapine (remeron) for sleep to see if effective.  ---------------  If possible okay to check BP.  Can check with pharmacy for Shingles vaccine, 2 doses if interested.  Consider COVID, RSV booster at the pharmacy.  --------------------  For Mammogram screening for breast cancer   Call the Imaging Center below anytime to schedule your own appointment now that order has been placed.  [facility] [address], [address] [place], [place] [address] Phone: [phone]   DUE for FASTING BLOOD WORK (no food or drink after midnight before the lab appointment, only water or coffee without cream/sugar on the morning of)  SCHEDULE "Lab Only" visit in the morning at the clinic for lab draw in 4 WEEKS   - Make sure Lab Only appointment is at about 1 week before your next appointment, so that results will be available  For Lab Results, once available within 2-3 days of blood draw, you can can log in to MyChart online to view your results and a brief explanation. Also, we can discuss results at next follow-up visit.    Please schedule a Follow-up Appointment to: Return for 2/28 lab and then follow-up Hypertension, Med Rec (Bring all medicinse) lab 10am, apt 1020.  If you have any other questions or concerns, please feel free to call the office or send a message through MyChart. You may also schedule an earlier appointment if necessary.  Additionally, you may be receiving a survey about your experience at our office within a few days to 1 week by e-mail or mail. We value your  feedback.  Saralyn Pilar, DO [facility], [place]

## 2023-05-17 NOTE — Progress Notes (Unsigned)
Subjective:    Patient ID: Sharon Craig, female    DOB: April 22, 1950, 73 y.o.   MRN: [id number]  Sharon Craig is a 73 y.o. female presenting on 05/17/2023 for Annual Exam   HPI  Discussed the use of AI scribe software for clinical note transcription with the patient, who gave verbal consent to proceed.  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  She has had gradual progressive decline over past years since 2018 with forgetfulness and memory Admits insomnia not always sleeping well at night, can wake up. She asks about sleeping medication or adjusting her mood med. - Her memory is affecting her still There is a family history of Dementia maternal aunt  Off Donepezil  Taking Mirtazapine 7.5 mg QHS PRN   Elevated A1c Due for lab, previous range 5-6   HYPERLIPIDEMIA: - Reports no concerns Fasting lipid - Currently taking Atorvastatin 20mg , tolerating well without side effects or myalgias Due for labs today   Hypothyroidism History of low thyroid, previously on levothyroxine daily, doing well with it Due for labs   CHRONIC HTN: Not checking BP. Elevated now off medication. She had improved BP reading at home w/ [facility] Nurse visit Current Meds - amlodipine 5mg  daily , HCTZ 25mg  daily, Metoprolol 25mg  XL daily   Reports good compliance, did not take meds today. Tolerating well, w/o complaints. Denies CP, dyspnea, HA, edema, dizziness / lightheadedness  Elevated A1c / Pre-Diabetes A1c 5.7% (05/08/23) POC home test by [facility] Nurse Admits eating inc carb sugars. Goal to improve   Health Maintenance:  Mammogram  due, April 2025. To be scheduled. Order in     05/17/2023   10:00 AM 11/05/2022    1:45 PM 02/01/2022    9:37 AM  Depression screen PHQ 2/9  Decreased Interest 0 0 0  Down, Depressed, Hopeless 0 0 1  PHQ - 2 Score 0 0 1  Altered sleeping 0 0 0  Tired, decreased energy 0 0 1  Change in appetite 0 0 1  Feeling bad or failure about yourself  0 0 0  Trouble concentrating 0 0 0  Moving slowly or fidgety/restless 0 0 0  Suicidal thoughts 0 0 0  PHQ-9 Score 0 0 3  Difficult doing work/chores Not difficult at all Not difficult at all Not difficult at all       02/01/2022    9:37 AM 06/13/2020   11:38 AM 01/24/2020    9:05 AM 12/14/2019   11:44 AM  GAD 7 : Generalized Anxiety Score  Nervous, Anxious, on Edge 0 1 2 2   Control/stop worrying 0 1 2 2   Worry too much - different things 1 2 2 1   Trouble relaxing 0 0 0 1  Restless 0 0 0 1  Easily annoyed or irritable 0 0 1 1  Afraid - awful might happen 0 1 1 2   Total GAD 7 Score 1 5 8 10   Anxiety Difficulty Not difficult at all Not difficult at all Not difficult at all Not difficult at all     Past Medical History:  Diagnosis Date   Hyperlipidemia    Hypothyroidism  Past Surgical History:  Procedure Laterality Date   ABDOMINAL HYSTERECTOMY     COLONOSCOPY WITH PROPOFOL N/A 12/05/2014   Procedure: COLONOSCOPY WITH PROPOFOL;  Surgeon: Wallace Cullens, MD;  Location: [facility] ENDOSCOPY;  Service: Gastroenterology;  Laterality: N/A;   KNEE SURGERY Left    TUBAL LIGATION     Social History   Socioeconomic History   Marital status: Married    Spouse name: Not on file   Number of children: Not on file   Years of education: Not on file   Highest education level: 12th grade  Occupational History   Not on file  Tobacco Use   Smoking status: Never   Smokeless tobacco: Never  Vaping Use   Vaping status: Never Used  Substance and Sexual Activity   Alcohol use: No   Drug use: No   Sexual activity: Not Currently  Other Topics Concern    Not on file  Social History Narrative   Not on file   Social Drivers of Health   Financial Resource Strain: Low Risk  (11/05/2022)   Overall Financial Resource Strain (CARDIA)    Difficulty of Paying Living Expenses: Not hard at all  Food Insecurity: No Food Insecurity (11/05/2022)   Hunger Vital Sign    Worried About Running Out of Food in the Last Year: Never true    Ran Out of Food in the Last Year: Never true  Transportation Needs: No Transportation Needs (11/05/2022)   PRAPARE - Administrator, Civil Service (Medical): No    Lack of Transportation (Non-Medical): No  Physical Activity: Insufficiently Active (11/05/2022)   Exercise Vital Sign    Days of Exercise per Week: 3 days    Minutes of Exercise per Session: 30 min  Stress: No Stress Concern Present (11/05/2022)   [employer] of Occupational Health - Occupational Stress Questionnaire    Feeling of Stress : Only a little  Social Connections: Moderately Integrated (11/05/2022)   Social Connection and Isolation Panel [NHANES]    Frequency of Communication with Friends and Family: More than three times a week    Frequency of Social Gatherings with Friends and Family: Three times a week    Attends Religious Services: More than 4 times per year    Active Member of Clubs or Organizations: No    Attends Banker Meetings: Never    Marital Status: Married  Catering manager Violence: Not At Risk (11/05/2022)   Humiliation, Afraid, Rape, and Kick questionnaire    Fear of Current or Ex-Partner: No    Emotionally Abused: No    Physically Abused: No    Sexually Abused: No   Family History  Problem Relation Age of Onset   Heart failure Mother    Hypertension Mother    Gout Mother    Hypothyroidism Mother    Heart Problems Sister    Breast cancer Sister    Heart Problems Brother    Dementia Maternal Aunt    Current Outpatient Medications on File Prior to Visit  Medication Sig   Cholecalciferol (D  1000) 25 MCG (1000 UT) capsule Take by mouth.   cyanocobalamin (VITAMIN B12) 1000 MCG tablet Take by mouth.   ferrous sulfate 325 (65 FE) MG tablet Take 325 mg by mouth daily with breakfast.   fluticasone (FLONASE) 50 MCG/ACT nasal spray SHAKE LIQUID AND USE 2 SPRAYS IN EACH NOSTRIL DAILY   metoprolol succinate (TOPROL-XL) 25 MG 24 hr tablet TAKE 1 TABLET(25 MG) BY MOUTH DAILY  No current facility-administered medications on file prior to visit.    Review of Systems  Constitutional:  Negative for activity change, appetite change, chills, diaphoresis, fatigue and fever.  HENT:  Negative for congestion and hearing loss.   Eyes:  Negative for visual disturbance.  Respiratory:  Negative for cough, chest tightness, shortness of breath and wheezing.   Cardiovascular:  Negative for chest pain, palpitations and leg swelling.  Gastrointestinal:  Negative for abdominal pain, constipation, diarrhea, nausea and vomiting.  Genitourinary:  Negative for dysuria, frequency and hematuria.  Musculoskeletal:  Negative for arthralgias and neck pain.  Skin:  Negative for rash.  Neurological:  Negative for dizziness, weakness, light-headedness, numbness and headaches.  Hematological:  Negative for adenopathy.  Psychiatric/Behavioral:  Positive for sleep disturbance. Negative for behavioral problems and dysphoric mood.        Memory loss   Per HPI unless specifically indicated above     Objective:    BP 134/60 (BP Location: Left Arm) Comment: Home Health nursing BP  Pulse (!) 59   Ht 5\' 9"  (1.753 m)   Wt 165 lb (74.8 kg)   SpO2 96%   BMI 24.37 kg/m   Wt Readings from Last 3 Encounters:  05/17/23 165 lb (74.8 kg)  11/05/22 176 lb (79.8 kg)  11/05/22 179 lb 3.2 oz (81.3 kg)    Physical Exam Vitals and nursing note reviewed.  Constitutional:      General: She is not in acute distress.    Appearance: She is well-developed. She is not diaphoretic.     Comments: Well-appearing, comfortable,  cooperative  HENT:     Head: Normocephalic and atraumatic.  Eyes:     General:        Right eye: No discharge.        Left eye: No discharge.     Conjunctiva/sclera: Conjunctivae normal.     Pupils: Pupils are equal, round, and reactive to light.  Neck:     Thyroid: No thyromegaly.  Cardiovascular:     Rate and Rhythm: Normal rate and regular rhythm.     Pulses: Normal pulses.     Heart sounds: Normal heart sounds. No murmur heard. Pulmonary:     Effort: Pulmonary effort is normal. No respiratory distress.     Breath sounds: Normal breath sounds. No wheezing or rales.  Abdominal:     General: Bowel sounds are normal. There is no distension.     Palpations: Abdomen is soft. There is no mass.     Tenderness: There is no abdominal tenderness.  Musculoskeletal:        General: No tenderness. Normal range of motion.     Cervical back: Normal range of motion and neck supple.     Right lower leg: No edema.     Left lower leg: No edema.     Comments: Upper / Lower Extremities: - Normal muscle tone, strength bilateral upper extremities 5/5, lower extremities 5/5  Lymphadenopathy:     Cervical: No cervical adenopathy.  Skin:    General: Skin is warm and dry.     Findings: No erythema or rash.  Neurological:     Mental Status: She is alert and oriented to person, place, and time.     Comments: Distal sensation intact to light touch all extremities  Psychiatric:        Mood and Affect: Mood normal.        Behavior: Behavior normal.        Thought Content: Thought  content normal.     Comments: Well groomed, good eye contact, normal speech and thoughts     Results for orders placed or performed in visit on 05/17/23  Hemoglobin A1c   Collection Time: 05/08/23 12:00 AM  Result Value Ref Range   Hemoglobin A1C 5.7       Assessment & Plan:   Problem List Items Addressed This Visit     Benign essential HTN   Relevant Medications   amLODipine (NORVASC) 5 MG tablet    atorvastatin (LIPITOR) 20 MG tablet   hydrochlorothiazide (HYDRODIURIL) 25 MG tablet   Other Relevant Orders   COMPLETE METABOLIC PANEL WITH GFR   CBC with Differential/Platelet   AMB Referral VBCI Care Management   Dyslipidemia   Relevant Medications   atorvastatin (LIPITOR) 20 MG tablet   Other Relevant Orders   TSH   Lipid panel   T4, free   Elevated hemoglobin A1c   GAD (generalized anxiety disorder)   Relevant Medications   sertraline (ZOLOFT) 50 MG tablet   mirtazapine (REMERON) 7.5 MG tablet   Hypothyroidism   Relevant Medications   levothyroxine (SYNTHROID) 25 MCG tablet   Other Relevant Orders   TSH   T4, free   AMB Referral VBCI Care Management   Major depressive disorder, recurrent, moderate (HCC)   Relevant Medications   sertraline (ZOLOFT) 50 MG tablet   mirtazapine (REMERON) 7.5 MG tablet   Memory loss   Relevant Orders   AMB Referral VBCI Care Management   Osteoarthritis of multiple joints   Psychophysiological insomnia   Relevant Medications   sertraline (ZOLOFT) 50 MG tablet   mirtazapine (REMERON) 7.5 MG tablet   Other Visit Diagnoses       Annual physical exam    -  Primary   Relevant Orders   TSH   Lipid panel   COMPLETE METABOLIC PANEL WITH GFR   CBC with Differential/Platelet     Flu vaccine need       Relevant Orders   Flu Vaccine Trivalent High Dose (Fluad)     Vitamin D deficiency       Relevant Orders   VITAMIN D 25 Hydroxy (Vit-D Deficiency, Fractures)     Vitamin B12 deficiency       Relevant Orders   Vitamin B12     Encounter for screening mammogram for malignant neoplasm of breast       Relevant Orders   MM 3D SCREENING MAMMOGRAM BILATERAL BREAST     Caregiver burden       Relevant Orders   AMB Referral VBCI Care Management        Updated Health Maintenance information Future fasting labs for 4 weeks while back on medication Encouraged improvement to lifestyle with diet and exercise Goal maintain healthy  weight  Hypertension Elevated blood pressure noted during visit. Patient does not currently monitor blood pressure at home. -Recommend purchasing a home blood pressure cuff and monitoring regularly. -Recheck blood pressure at next visit.  Mild Pre-Diabetes Elevated A1c 5.7  No actual significant concern Avoid excess sugar intake  Hypothyroidism Patient has been off levothyroxine for approximately one month. -Resume levothyroxine daily. -Check thyroid function tests in 4 weeks after resuming medication.  General Health Maintenance -Order comprehensive metabolic panel, lipid panel, vitamin B12, and vitamin D levels to be drawn in 4 weeks. -Patient to call to schedule mammogram for breast cancer screening in April 2025 -Consider shingles vaccine and COVID-19 booster at pharmacy. -Referral to [facility] team for additional  support and monitoring. 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  Insomnia Past trial on Trazodone unsuccessful Patient has been taking mirtazapine intermittently for sleep. -Resume regular use of mirtazapine as needed for sleep. -Reevaluate effectiveness at next visit.  Diet Patient inquires about dietary recommendations. -Advise limiting red meat and pork intake, focusing on lean proteins like chicken and fish.        Orders Placed This Encounter  Procedures   MM 3D SCREENING MAMMOGRAM BILATERAL BREAST    Standing Status:   Future    Expiration Date:   05/16/2024    Reason for Exam (SYMPTOM  OR DIAGNOSIS REQUIRED):   Screening bilateral 3D Mammogram Tomo    Preferred imaging location?:   [facility]   Flu Vaccine Trivalent High Dose (Fluad)   Hemoglobin A1c    This external order was created through the Results Console.   TSH    Standing Status:   Future    Expected Date:   06/17/2023    Expiration Date:   05/16/2024   Lipid panel    Standing Status:   Future    Expected Date:    06/17/2023    Expiration Date:   05/16/2024    Has the patient fasted?:   Yes   COMPLETE METABOLIC PANEL WITH GFR    Standing Status:   Future    Expected Date:   06/17/2023    Expiration Date:   05/16/2024   CBC with Differential/Platelet    Standing Status:   Future    Expected Date:   06/17/2023    Expiration Date:   05/16/2024   T4, free    Standing Status:   Future    Expected Date:   06/17/2023    Expiration Date:   05/16/2024   Vitamin B12    Standing Status:   Future    Expected Date:   06/17/2023    Expiration Date:   05/16/2024   VITAMIN D 25 Hydroxy (Vit-D Deficiency, Fractures)    Standing Status:   Future    Expected Date:   06/17/2023    Expiration Date:   05/16/2024   AMB Referral VBCI Care Management    Referral Priority:   Routine    Referral Type:   Consultation    Referral Reason:   Care Coordination    Number of Visits Requested:   1    Meds ordered this encounter  Medications   amLODipine (NORVASC) 5 MG tablet    Sig: Take 1 tablet (5 mg total) by mouth daily.    Dispense:  90 tablet    Refill:  3    Add refills for 1 year   atorvastatin (LIPITOR) 20 MG tablet    Sig: Take 1 tablet (20 mg total) by mouth daily.    Dispense:  90 tablet    Refill:  3   hydrochlorothiazide (HYDRODIURIL) 25 MG tablet    Sig: Take 1 tablet (25 mg total) by mouth daily.    Dispense:  90 tablet    Refill:  3    Add refills for 1 year   levothyroxine (SYNTHROID) 25 MCG tablet    Sig: Take 1 tablet (25 mcg total) by mouth daily before breakfast.    Dispense:  90 tablet    Refill:  3    Add refills for 1 year   sertraline (ZOLOFT) 50 MG tablet    Sig: Take 1 tablet (50 mg  total) by mouth daily.    Dispense:  90 tablet    Refill:  3    Add refills for 1 year   mirtazapine (REMERON) 7.5 MG tablet    Sig: Take 1 tablet (7.5 mg total) by mouth at bedtime.    Dispense:  30 tablet    Refill:  2     Follow up plan:  Return for 2/28 lab and then follow-up Hypertension, Med Rec  (Bring all medicinse) lab 10am, apt 1020.   Upcoming labs morning fasting in 4 weeks, prefer Friday early AM  Saralyn Pilar, DO [facility] 05/17/2023, 9:35 AM

## 2023-05-18 ENCOUNTER — Encounter: Payer: Self-pay | Admitting: Family Medicine

## 2023-05-18 ENCOUNTER — Telehealth: Payer: Self-pay | Admitting: *Deleted

## 2023-05-18 DIAGNOSIS — Z23 Encounter for immunization: Secondary | ICD-10-CM

## 2023-05-18 DIAGNOSIS — Z0000 Encounter for general adult medical examination without abnormal findings: Secondary | ICD-10-CM | POA: Diagnosis not present

## 2023-05-18 DIAGNOSIS — E039 Hypothyroidism, unspecified: Secondary | ICD-10-CM | POA: Diagnosis not present

## 2023-05-18 NOTE — Progress Notes (Signed)
Complex Care Management Note  Care Guide Note 05/18/2023 Name: Sharon Craig MRN: [id number] DOB: 14-May-1950  Sharon Craig is a 73 y.o. year old female who sees Smitty Cords, DO for primary care. I reached out to Sharon Craig by phone today to offer complex care management services.  Sharon Craig was given information about Complex Care Management services today including:   The Complex Care Management services include support from the care team which includes your Nurse Coordinator, Clinical Social Worker, or Pharmacist.  The Complex Care Management team is here to help remove barriers to the health concerns and goals most important to you. Complex Care Management services are voluntary, and the patient may decline or stop services at any time by request to their care team member.   Complex Care Management Consent Status: Patient agreed to services and verbal consent obtained.   Follow up plan:  Telephone appointment with complex care management team member scheduled for:  05/23/2023  Encounter Outcome:  Patient Scheduled  Burman Nieves, CMA, Care Guide [facility]  [facility], [facility] Guide Direct Dial: [phone]  Fax: [phone] Website: [email or web address]

## 2023-05-19 ENCOUNTER — Telehealth: Payer: Self-pay

## 2023-05-19 NOTE — Progress Notes (Signed)
Care Guide Pharmacy Note  05/19/2023 Name: Sharon Craig MRN: [id number] DOB: [birth year]  Referred By: Smitty Cords, DO Reason for referral: Care Coordination (Outreach to schedule with Pharm d )   Sharon Craig is a 73 y.o. year old female who is a primary care patient of Smitty Cords, DO.  Sharon Craig was referred to the pharmacist for assistance related to: HTN  Successful contact was made with the patient to discuss pharmacy services including being ready for the pharmacist to call at least 5 minutes before the scheduled appointment time and to have medication bottles and any blood pressure readings ready for review. The patient agreed to meet with the pharmacist via telephone visit on (date/time).06/03/2023  Penne Lash , RMA     [place]  [facility], [facility] Guide  Direct Dial: [phone]  Website: [email or web address]

## 2023-05-23 ENCOUNTER — Encounter: Payer: Self-pay | Admitting: *Deleted

## 2023-05-23 ENCOUNTER — Telehealth: Payer: Self-pay | Admitting: *Deleted

## 2023-05-23 NOTE — Patient Outreach (Signed)
  Care Coordination   05/23/2023 Name: Sharon Craig MRN: [id number] DOB: November 09, 1950   Care Coordination Outreach Attempts:  An unsuccessful telephone outreach was attempted today to offer the patient information about available complex care management services.  Follow Up Plan:  Additional outreach attempts will be made to offer the patient complex care management information and services.   Encounter Outcome:  No Answer   Care Coordination Interventions:  No, not indicated     Laverda Stribling, LCSW [place]  [facility], [facility] Licensed Clinical Social Worker Care Coordinator  Direct Dial: [phone]

## 2023-05-30 ENCOUNTER — Telehealth: Payer: Self-pay | Admitting: *Deleted

## 2023-05-30 NOTE — Progress Notes (Signed)
 Complex Care Management Care Guide Note  05/30/2023 Name: [name] MRN: [id number] DOB: [birth year]  Sharon Craig is a 73 y.o. year old female who is a primary care patient of Raina Bunting, DO and is actively engaged with the care management team. I reached out to Sharon Castleman Johannesen by phone today to assist with re-scheduling  with the Licensed Clinical Social Worker.  Follow up plan: Telephone appointment with complex care management team member scheduled for:  06/06/2023  Kandis Ormond, CMA, Care Guide [facility], [facility] Guide Direct Dial: [phone]  Fax: [phone] Website: [email or web address]

## 2023-05-30 NOTE — Progress Notes (Signed)
 Complex Care Management Care Guide Note  05/30/2023 Name: [name] MRN: [id number] DOB: 01/22/1951  Sharon Craig Primas is a 73 y.o. year old female who is a primary care patient of Raina Bunting, DO and is actively engaged with the care management team. I reached out to Sharon Craig Breunig by phone today to assist with re-scheduling  with the Licensed Clinical Social Worker.  Follow up plan: Unsuccessful telephone outreach attempt made. A HIPAA compliant phone message was left for the patient providing contact information and requesting a return call.  Kandis Ormond, CMA, Care Guide [facility]  [facility], [facility] Guide Direct Dial: [phone]  Fax: [phone] Website: [email or web address]

## 2023-06-03 ENCOUNTER — Other Ambulatory Visit: Payer: Medicare Other | Admitting: Pharmacist

## 2023-06-03 DIAGNOSIS — I10 Essential (primary) hypertension: Secondary | ICD-10-CM

## 2023-06-03 NOTE — Progress Notes (Signed)
06/03/2023 Name: Sharon Craig MRN: [id number] DOB: 15-Jul-1950  Chief Complaint  Patient presents with   Medication Adherence   Medication Management    [name] Sharon Craig is a 73 y.o. year old female who presented for a telephone visit.   They were referred to the pharmacist by their PCP for assistance in managing medication adherence.    Subjective:  Care Team: Primary Care Provider: Smitty Cords, DO ; Next Scheduled Visit: 06/17/2023 Social Worker: Verna Czech [place], [place]; Next Scheduled Visit: 06/06/2023  Medication Access/Adherence  Current Pharmacy:  [facility] #[id number] [place], [place] - [address] AT [facility] OF [address] [place] [address] [place] [address] Phone: [phone] Fax: [phone]  [facility]) - [place], [place] - [address] [facility] [address] [place] [address] [place] [address] Phone: [phone] Fax: [phone]  [facility] STORE #[id number] - [place], [place] - [address] AT [facility] OF [facility] & [address] [place] [address] Phone: [phone] Fax: [phone]   Patient reports affordability concerns with their medications: No  Patient reports access/transportation concerns to their pharmacy: No  Patient reports adherence concerns with their medications:  Yes    Denies using weekly pillbox. Denies taking at consistent time, rather taking it when she thinks about it  Hypertension:  Current medications:  - amlodipine 5 mg daily - HCTZ 25 mg daily - metoprolol ER 25 mg daily   Patient has an automated, upper arm home BP cuff Denies checking home blood pressure recently  Patient denies hypotensive s/sx including dizziness, lightheadedness.     Objective:  Lab Results  Component Value Date   CREATININE 0.84 02/01/2022   BUN 27 (H) 02/01/2022   NA 143 02/01/2022   K 3.5 02/01/2022   CL 105 02/01/2022   CO2 29 02/01/2022    Lab  Results  Component Value Date   CHOL 150 02/01/2022   HDL 53 02/01/2022   LDLCALC 83 02/01/2022   TRIG 61 02/01/2022   CHOLHDL 2.8 02/01/2022   BP Readings from Last 3 Encounters:  05/17/23 134/60  11/05/22 134/72  11/05/22 132/60   Pulse Readings from Last 3 Encounters:  05/17/23 (!) 59  11/05/22 68  02/01/22 (!) 59     Medications Reviewed Today     Reviewed by Manuela Neptune, RPH-CPP (Pharmacist) on 06/03/23 at 0909  Med List Status: <None>   Medication Order Taking? Sig Documenting Provider Last Dose Status Informant  amLODipine (NORVASC) 5 MG tablet [id number] Yes Take 1 tablet (5 mg total) by mouth daily. Smitty Cords, DO Taking Active   atorvastatin (LIPITOR) 20 MG tablet [id number] Yes Take 1 tablet (20 mg total) by mouth daily. Smitty Cords, DO Taking Active   Cholecalciferol (D 1000) 25 MCG (1000 UT) capsule [id number]  Take by mouth. [provider]  Active   cyanocobalamin (VITAMIN B12) 1000 MCG tablet [id number]  Take by mouth. [provider]  Active   ferrous sulfate 325 (65 FE) MG tablet [id number]  Take 325 mg by mouth daily with breakfast. [provider]  Active            Med Note (HILL, TIFFANY A   Tue Jan 23, 2019 11:14 AM)    fluticasone (FLONASE) 50 MCG/ACT nasal spray [id number]  SHAKE LIQUID AND USE 2 SPRAYS IN EACH NOSTRIL DAILY Althea Charon, Netta Neat, DO  Active  hydrochlorothiazide (HYDRODIURIL) 25 MG tablet [id number] Yes Take 1 tablet (25 mg total) by mouth daily. Smitty Cords, DO Taking Active   levothyroxine (SYNTHROID) 25 MCG tablet [id number] Yes Take 1 tablet (25 mcg total) by mouth daily before breakfast. Smitty Cords, DO Taking Active   metoprolol succinate (TOPROL-XL) 25 MG 24 hr tablet [id number] Yes TAKE 1 TABLET(25 MG) BY MOUTH DAILY Althea Charon, Netta Neat, DO Taking Active   mirtazapine (REMERON) 7.5 MG tablet [id number]  Take 1 tablet (7.5 mg total) by mouth at  bedtime. Karamalegos, Netta Neat, DO  Active   sertraline (ZOLOFT) 50 MG tablet [id number]  Take 1 tablet (50 mg total) by mouth daily. Smitty Cords, DO  Active               Assessment/Plan:   Encourage patient to start using weekly pillbox  Hypertension: - Reviewed long term cardiovascular and renal outcomes of uncontrolled blood pressure - Reviewed appropriate blood pressure monitoring technique and reviewed goal blood pressure.  - Recommend to monitor home blood pressure, keep log of results and have this record to review at upcoming medical appointments. Patient to contact provider office sooner if needed for readings outside of established parameters or symptoms - Will mail patient handout on home blood pressure monitoring and BP log as requested   Follow Up Plan: Clinical Pharmacist will follow up with patient by telephone on 07/01/2023 at 9:00 AM   Estelle Grumbles, PharmD, [place], CPP Clinical Pharmacist [facility] [phone]

## 2023-06-03 NOTE — Patient Instructions (Signed)
Goals Addressed             This Visit's Progress    Pharmacy Goals       It was a pleasure speaking with you today!   Please keep taking your levothyroxine each morning about 30 minutes before breakfast.   We talked about taking your blood pressure medications all at the same time each day.   Your blood pressure medications are:  - amlodipine 5 mg daily - hydrochlorothiazide 25 mg daily - metoprolol ER 25 mg daily   Check your blood pressure twice weekly, and any time you have concerning symptoms like headache, chest pain, dizziness, shortness of breath, or vision changes.    Our goal is less than 130/80.   To appropriately check your blood pressure, make sure you do the following:  1) Avoid caffeine, exercise, or tobacco products for 30 minutes before checking. Empty your bladder. 2) Sit with your back supported in a flat-backed chair. Rest your arm on something flat (arm of the chair, table, etc). 3) Sit still with your feet flat on the floor, resting, for at least 5 minutes.  4) Check your blood pressure. Take 1-2 readings.  5) Write down these readings and bring with you to any provider appointments.   Make sure you take your blood pressure medications before you come to any office visit, even if you were asked to fast for labs.   Please do not hesitate to contact me at the number below with any questions or additional information you have regarding your care.   Estelle Grumbles, PharmD, [place], CPP Clinical Pharmacist [facility] [phone]

## 2023-06-06 ENCOUNTER — Ambulatory Visit: Payer: Self-pay | Admitting: *Deleted

## 2023-06-06 NOTE — Patient Instructions (Signed)
 Visit Information  Thank you for taking time to visit with me today. Please don't hesitate to contact me if I can be of assistance to you.   Following are the goals we discussed today:   Goals Addressed             This Visit's Progress    care coordination activities        EMOTIONAL / MENTAL HEALTH SUPPORT Keep all upcoming appointment discussed today Continue with compliance of taking medication prescribed by Doctor Self Support options  (continue plan to keep a to-do list/daily structure to remain organized, follow up with pharmacist regarding medications, consider ongoing mental health counseling )         Our next appointment is by telephone on 06/20/23 at 2-m  Please call the care guide team at [phone] if you need to cancel or reschedule your appointment.   If you are experiencing a Mental Health or Behavioral Health Crisis or need someone to talk to, please call 911   Patient verbalizes understanding of instructions and care plan provided today and agrees to view in MyChart. Active MyChart status and patient understanding of how to access instructions and care plan via MyChart confirmed with patient.     Telephone follow up appointment with care management team member scheduled for: 06/20/23  Verna Czech, LCSW [place]  [facility], [facility] Licensed Clinical Social Worker Care Coordinator  Direct Dial: [phone]

## 2023-06-06 NOTE — Patient Outreach (Signed)
  Care Coordination   Initial Visit Note   06/07/2023 Name: Sharon Craig MRN: [id number] DOB: 1950-08-18  Sharon Craig is a 73 y.o. year old female who sees Sharon Cords, DO for primary care. I spoke with  Sharon Craig by phone today.  What matters to the patients health and wellness today?  Patient requesting assistance with managing her medications as well as strategies to address challenges with memory.  Goals Addressed             This Visit's Progress    care coordination activities        EMOTIONAL / MENTAL HEALTH SUPPORT Keep all upcoming appointment discussed today Continue with compliance of taking medication prescribed by Doctor Self Support options  (continue plan to keep a to-do list/daily structure to remain organized, follow up with pharmacist regarding medications, consider ongoing mental health counseling )         SDOH assessments and interventions completed:  Yes  SDOH Interventions Today    Flowsheet Row Most Recent Value  SDOH Interventions   Food Insecurity Interventions Intervention Not Indicated  Housing Interventions Intervention Not Indicated  Transportation Interventions Intervention Not Indicated, Patient Resources (Friends/Family)        Care Coordination Interventions:  Yes, provided  Interventions Today    Flowsheet Row Most Recent Value  Chronic Disease   Chronic disease during today's visit Hypertension (HTN), Other  [Memory loss, Caregiver burden]  General Interventions   General Interventions Discussed/Reviewed --  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 Patient confirms anxiety related to spouse's medical condition however is not overwhelmed with is care. Confirmed continued assistance needs with managing her medications]  Mental Health Interventions   Mental Health Discussed/Reviewed Mental Health Discussed, Coping Strategies, Anxiety  [Patient assessed for mental health  needs-caregiver stress/anxiety acknowledged-memory challenges review-coping strategies reviewed-encouraged creating a to-do list, establishing routines to remain organized- emotional support provided]  Pharmacy Interventions   Pharmacy Dicussed/Reviewed Pharmacy Topics Discussed, Medication Adherence       Follow up plan: Follow up call scheduled for 06/20/23    Encounter Outcome:  Patient Visit Completed

## 2023-06-17 ENCOUNTER — Ambulatory Visit: Payer: Self-pay | Admitting: Family Medicine

## 2023-06-17 ENCOUNTER — Other Ambulatory Visit: Payer: Medicare Other

## 2023-06-17 ENCOUNTER — Other Ambulatory Visit: Payer: Self-pay

## 2023-06-17 DIAGNOSIS — E559 Vitamin D deficiency, unspecified: Secondary | ICD-10-CM

## 2023-06-17 DIAGNOSIS — I10 Essential (primary) hypertension: Secondary | ICD-10-CM

## 2023-06-17 DIAGNOSIS — E538 Deficiency of other specified B group vitamins: Secondary | ICD-10-CM

## 2023-06-17 DIAGNOSIS — E785 Hyperlipidemia, unspecified: Secondary | ICD-10-CM

## 2023-06-17 DIAGNOSIS — Z0000 Encounter for general adult medical examination without abnormal findings: Secondary | ICD-10-CM

## 2023-06-17 DIAGNOSIS — E039 Hypothyroidism, unspecified: Secondary | ICD-10-CM

## 2023-06-20 ENCOUNTER — Ambulatory Visit: Payer: Self-pay | Admitting: *Deleted

## 2023-06-20 NOTE — Patient Outreach (Signed)
 Care Coordination   Follow Up Visit Note   06/20/2023 Name: Toniann Dickerson MRN: [id number] DOB: [birth year]  Darryl Nestle Bugge is a 73 y.o. year old female who sees Smitty Cords, DO for primary care. I spoke with  Darryl Nestle Goody by phone today.  What matters to the patients health and wellness today?  Patient reports having no additional community resource needs at this time.    Goals Addressed             This Visit's Progress    care coordination activities        EMOTIONAL / MENTAL HEALTH SUPPORT Keep all upcoming appointment discussed today Continue with compliance of taking medication prescribed by Doctor Self Support options  (continue plan to keep a to-do list/daily structure to remain organized, follow up with pharmacist regarding medications, consider ongoing mental health counseling ) Contact this social worker with any additional community resource needs         SDOH assessments and interventions completed:  No     Care Coordination Interventions:  Yes, provided  Interventions Today    Flowsheet Row Most Recent Value  Chronic Disease   Chronic disease during today's visit Hypertension (HTN), Other  [memory los, caregiver stress]  General Interventions   General Interventions Discussed/Reviewed General Interventions Reviewed  [Follow up phone call to patient to assess for additional support needs. Patient confirms receiving all of her medications at this time. Continues to assist with spouses care. Confirms having no additional needs at this time]  Mental Health Interventions   Mental Health Discussed/Reviewed Mental Health Discussed, Coping Strategies, Anxiety  [Caregiver stress/anxiety acknowledged-patient confirms that memory challenges have improved-continues to utilize lists and the establishment of routine to remain organized]  Pharmacy Interventions   Pharmacy Dicussed/Reviewed Pharmacy Topics Discussed, Medications and  their functions  [patient states that she is not taking her sertraline, states was not sure what it was for-confirmed medication and its function-encouraged follow up with provider/pharmacists for additional information regarding medications and their functions]       Follow up plan: No further intervention required.   Encounter Outcome:  Patient Visit Completed

## 2023-06-20 NOTE — Patient Instructions (Signed)
 Visit Information  Thank you for taking time to visit with me today. Please don't hesitate to contact me if I can be of assistance to you.   Following are the goals we discussed today:   Goals Addressed             This Visit's Progress    care coordination activities        EMOTIONAL / MENTAL HEALTH SUPPORT Keep all upcoming appointment discussed today Continue with compliance of taking medication prescribed by Doctor Self Support options  (continue plan to keep a to-do list/daily structure to remain organized, follow up with pharmacist regarding medications, consider ongoing mental health counseling ) Contact this social worker with any additional community resource needs        If you are experiencing a Mental Health or Behavioral Health Crisis or need someone to talk to, please call 911   Patient verbalizes understanding of instructions and care plan provided today and agrees to view in MyChart. Active MyChart status and patient understanding of how to access instructions and care plan via MyChart confirmed with patient.     No further follow up required: patient to contact this Child psychotherapist with any additional community resource needs.  Kaelin Bonelli, LCSW [place]  [facility], [facility] Licensed Clinical Social Worker Care Coordinator  Direct Dial: [phone]

## 2023-07-01 ENCOUNTER — Telehealth: Payer: Self-pay | Admitting: Pharmacist

## 2023-07-01 ENCOUNTER — Other Ambulatory Visit: Payer: Medicare Other

## 2023-07-01 NOTE — Telephone Encounter (Signed)
   Outreach Note  07/01/2023 Name: Alexsus Papadopoulos MRN: [id number] DOB: 01/15/1951  Referred by: Smitty Cords, DO  Was unable to reach patient via telephone today x 2 and unable to leave a message as no voicemail picks up   Follow Up Plan: Will collaborate with Care Guide to outreach to schedule follow up with me  Estelle Grumbles, PharmD, [place], CPP Clinical Pharmacist [facility] [phone]

## 2023-07-29 ENCOUNTER — Other Ambulatory Visit (INDEPENDENT_AMBULATORY_CARE_PROVIDER_SITE_OTHER): Admitting: Pharmacist

## 2023-07-29 DIAGNOSIS — I10 Essential (primary) hypertension: Secondary | ICD-10-CM

## 2023-07-29 NOTE — Progress Notes (Signed)
 08/01/2023 Name: Sharon Craig MRN: [id number] DOB: 05-09-1950  Chief Complaint  Patient presents with   Medication Management   Medication Adherence    Sharon Craig is a 73 y.o. year old female who presented for a telephone visit.   They were referred to the pharmacist by their PCP for assistance in managing medication adherence.      Subjective:   Care Team: Primary Care Provider: Raina Bunting, DO  Social Worker: [place], [place]   Medication Access/Adherence  Current Pharmacy:  [facility] #[id number] [place], [place] - [address] AT [facility] OF [address] [place] [address] [place] [address] Phone: [phone] Fax: [phone]  [facility] Delivery) - [place], [place] - [address] [facility] [address] [place] [address] [place] [address] Phone: [phone] Fax: [phone]  [facility] STORE [phone] - [place], [place] - [address] AT [facility] OF [facility] [address] [place] [address] [place] [address] Phone: [phone] Fax: [phone]   Patient reports affordability concerns with their medications: No  Patient reports access/transportation concerns to their pharmacy: No  Patient reports adherence concerns with their medications: No   Denies using weekly pillbox   Hypertension:   Current medications:  - amlodipine 5 mg daily - HCTZ 25 mg daily - metoprolol ER 25 mg daily     Patient has an automated, upper arm home BP cuff Denies checking home blood pressure recently   Patient denies hypotensive s/sx including dizziness, lightheadedness.   Objective:  Lab Results  Component Value Date   HGBA1C 5.7 05/08/2023    Lab Results  Component Value Date   CREATININE 0.84 02/01/2022   BUN 27 (H) 02/01/2022   NA 143 02/01/2022   K 3.5 02/01/2022   CL 105 02/01/2022   CO2 29 02/01/2022    Lab Results  Component Value Date   CHOL 150 02/01/2022   HDL 53 02/01/2022    LDLCALC 83 02/01/2022   TRIG 61 02/01/2022   CHOLHDL 2.8 02/01/2022   BP Readings from Last 3 Encounters:  05/17/23 134/60  11/05/22 134/72  11/05/22 132/60   Pulse Readings from Last 3 Encounters:  05/17/23 (!) 59  11/05/22 68  02/01/22 (!) 59     Medications Reviewed Today     Reviewed by Ardis Becton, RPH-CPP (Pharmacist) on 07/29/23 at 1140  Med List Status: <None>   Medication Order Taking? Sig Documenting Provider Last Dose Status Informant  amLODipine (NORVASC) 5 MG tablet [id number] Yes Take 1 tablet (5 mg total) by mouth daily. Raina Bunting, DO Taking Active   atorvastatin (LIPITOR) 20 MG tablet [id number]  Take 1 tablet (20 mg total) by mouth daily. Raina Bunting, DO  Active   Cholecalciferol (D 1000) 25 MCG (1000 UT) capsule [id number]  Take by mouth. [provider]  Active   cyanocobalamin (VITAMIN B12) 1000 MCG tablet [id number]  Take by mouth. [provider]  Active   ferrous sulfate 325 (65 FE) MG tablet [id number]  Take 325 mg by mouth daily with breakfast. [provider]  Active            Med Note (HILL, TIFFANY A   Tue Jan 23, 2019 11:14 AM)    fluticasone (FLONASE) 50 MCG/ACT nasal spray [id number]  SHAKE LIQUID AND USE 2 SPRAYS IN EACH NOSTRIL DAILY Karamalegos, Kayleen Party, DO  Active   hydrochlorothiazide (HYDRODIURIL)  25 MG tablet [id number] Yes Take 1 tablet (25 mg total) by mouth daily. Raina Bunting, DO Taking Active   levothyroxine (SYNTHROID) 25 MCG tablet [id number]  Take 1 tablet (25 mcg total) by mouth daily before breakfast. Raina Bunting, DO  Active   metoprolol succinate (TOPROL-XL) 25 MG 24 hr tablet [id number] Yes TAKE 1 TABLET(25 MG) BY MOUTH DAILY Romeo Co, Kayleen Party, DO Taking Active   mirtazapine (REMERON) 7.5 MG tablet [id number]  Take 1 tablet (7.5 mg total) by mouth at bedtime. Karamalegos, Kayleen Party, DO  Active   sertraline (ZOLOFT) 50 MG tablet [id number]  Take 1  tablet (50 mg total) by mouth daily. Raina Bunting, DO  Active               Assessment/Plan:   Discuss importance of medication adherence. Encourage patient to take her medication consistently at same time each day.  - Encourage patient to use weekly pillbox  Advise patient to contact office to reschedule missed Office Visit appointment with PCP   Hypertension: - Reviewed long term cardiovascular and renal outcomes of uncontrolled blood pressure - Reviewed appropriate blood pressure monitoring technique and reviewed goal blood pressure.  - Recommend to monitor home blood pressure, keep log of results and have this record to review at upcoming medical appointments. Patient to contact provider office sooner if needed for readings outside of established parameters or symptoms      Follow Up Plan: Clinical Pharmacist will follow up with patient by telephone in 3 months   Arthur Lash, PharmD, Becky [place], CPP Clinical Pharmacist [facility] [phone]

## 2023-08-01 NOTE — Patient Instructions (Signed)
 Goals Addressed             This Visit's Progress    Pharmacy Goals       It was a pleasure speaking with you today!   Please keep taking your levothyroxine each morning about 30 minutes before breakfast.   We talked about taking your blood pressure medications all at the same time each day.   Your blood pressure medications are:  - amlodipine 5 mg daily - hydrochlorothiazide 25 mg daily - metoprolol ER 25 mg daily   Check your blood pressure twice weekly, and any time you have concerning symptoms like headache, chest pain, dizziness, shortness of breath, or vision changes.    Our goal is less than 130/80.   To appropriately check your blood pressure, make sure you do the following:  1) Avoid caffeine, exercise, or tobacco products for 30 minutes before checking. Empty your bladder. 2) Sit with your back supported in a flat-backed chair. Rest your arm on something flat (arm of the chair, table, etc). 3) Sit still with your feet flat on the floor, resting, for at least 5 minutes.  4) Check your blood pressure. Take 1-2 readings.  5) Write down these readings and bring with you to any provider appointments.   Make sure you take your blood pressure medications before you come to any office visit, even if you were asked to fast for labs.   Please do not hesitate to contact me at the number below with any questions or additional information you have regarding your care.   Estelle Grumbles, PharmD, Patsy [place], CPP Clinical Pharmacist [facility] [phone]

## 2023-08-15 ENCOUNTER — Ambulatory Visit
Admission: RE | Admit: 2023-08-15 | Discharge: 2023-08-15 | Disposition: A | Discharge status: Home / Self Care | Payer: Medicare Other | Source: Ambulatory Visit | Attending: Family Medicine | Admitting: Family Medicine

## 2023-08-15 DIAGNOSIS — Z1231 Encounter for screening mammogram for malignant neoplasm of breast: Secondary | ICD-10-CM | POA: Insufficient documentation

## 2023-08-24 ENCOUNTER — Ambulatory Visit: Payer: Self-pay

## 2023-08-24 NOTE — Telephone Encounter (Signed)
 Routing comments to both [facility] Clinical Pool and to [facility].  Yes we can follow up with her if she is ready to schedule to see me.  Also, routing to [place] for review. It looks like she just spoke to you on 07/29/23 for patient outreach encounter.  She may be a good patient for in person visit as well perhaps! Otherwise, would you be able to keep a closer follow-up? I think she is next on schedule in July it shows on the schedule.  Domingo Friend, DO [facility] 08/24/2023, 11:10 AM

## 2023-08-24 NOTE — Telephone Encounter (Signed)
 Spoke to patient, she is arranging transportation prior to scheduling an appt, she will call back to get an appt scheduled to see Dr. Romeo Co

## 2023-08-24 NOTE — Telephone Encounter (Signed)
 Copied from CRM [id number]. Topic: Clinical - Red Word Triage >> Aug 24, 2023 10:17 AM Zipporah Him wrote: Red Word that prompted transfer to Nurse Triage: patient states she has had high blood pressure recently and her last home health visit it was high as well. She states it is also high now   Chief Complaint: High blood pressure  Symptoms: No symptoms  Frequency: Frequent  Pertinent Negatives: Patient denies dizziness, chest pain, headache  Disposition: [] ED /[] Urgent Care (no appt availability in office) / [x] Appointment(In office/virtual)/ []  [facility]/ [] Home Care/ [] Refused Recommended Disposition /[] [facility]/ []  Follow-up with PCP Additional Notes: Patient reports she had high blood pressure during her last home health visit. She states that her blood pressure today was also elevated at 157/85. She denies any symptoms at this time. I discussed her medication with her due to questions she had about when to take her medication. Patient will call back to schedule an appointment after checking on a ride.   Reason for Disposition  [1] Systolic BP  >= 130 OR Diastolic >= 80 AND [2] taking BP medications  Answer Assessment - Initial Assessment Questions 1. BLOOD PRESSURE: "What is the blood pressure?" "Did you take at least two measurements 5 minutes apart?"     157/85 2. ONSET: "When did you take your blood pressure?"     Today  3. HOW: "How did you take your blood pressure?" (e.g., automatic home BP monitor, visiting nurse)     Automatic BP cuff  4. HISTORY: "Do you have a history of high blood pressure?"     Yes 5. MEDICINES: "Are you taking any medicines for blood pressure?" "Have you missed any doses recently?"     Yes, has not missed a dose  6. OTHER SYMPTOMS: "Do you have any symptoms?" (e.g., blurred vision, chest pain, difficulty breathing, headache, weakness)     No  Protocols used: Blood Pressure - High-A-AH

## 2023-08-25 ENCOUNTER — Telehealth: Payer: Self-pay

## 2023-08-25 NOTE — Telephone Encounter (Signed)
 Copied from CRM [id number]. Topic: Clinical - Medication Question >> Aug 25, 2023 11:04 AM Everlene Hobby D wrote: Patient isn't sure what medications she should be taking and would like a call back [phone]

## 2023-08-29 ENCOUNTER — Ambulatory Visit (INDEPENDENT_AMBULATORY_CARE_PROVIDER_SITE_OTHER): Admitting: Family Medicine

## 2023-08-29 ENCOUNTER — Encounter: Payer: Self-pay | Admitting: Family Medicine

## 2023-08-29 VITALS — BP 132/60 | HR 82 | Ht 69.0 in | Wt 177.0 lb

## 2023-08-29 DIAGNOSIS — E538 Deficiency of other specified B group vitamins: Secondary | ICD-10-CM

## 2023-08-29 DIAGNOSIS — E039 Hypothyroidism, unspecified: Secondary | ICD-10-CM

## 2023-08-29 DIAGNOSIS — I10 Essential (primary) hypertension: Secondary | ICD-10-CM | POA: Diagnosis not present

## 2023-08-29 DIAGNOSIS — R7309 Other abnormal glucose: Secondary | ICD-10-CM | POA: Diagnosis not present

## 2023-08-29 DIAGNOSIS — E785 Hyperlipidemia, unspecified: Secondary | ICD-10-CM

## 2023-08-29 DIAGNOSIS — E559 Vitamin D deficiency, unspecified: Secondary | ICD-10-CM | POA: Diagnosis not present

## 2023-08-29 NOTE — Progress Notes (Signed)
 Subjective:    Patient ID: Sharon Craig, female    DOB: [birth year], 73 y.o.   MRN: [id number]  Sharon Craig is a 73 y.o. female presenting on 08/29/2023 for No chief complaint on file.   HPI  Discussed the use of AI scribe software for clinical note transcription with the patient, who gave verbal consent to proceed.  History of Present Illness   Sharon Craig is a 73 year old female with hypertension who presents with concerns about elevated blood pressure readings.  She has been experiencing elevated blood pressure readings at home. She did not take her medications today due to being busy preparing for the visit. Her current medications for blood pressure management include amlodipine , hydrochlorothiazide , and metoprolol . She sometimes forgets to take them at the same time, which affects the accuracy of her blood pressure readings. When she takes her medications as directed, her blood pressure readings improve.  She recalls speaking with a pharmacist about a month ago, who helps organize her medications  She is concerned about dietary factors affecting her blood pressure, mentioning a high intake of diet Dr. Kathlene Paradise and sweets, although she has cut down on sweets. She does not consume alcohol regularly and has not smoked in years.  Her current medications also include levothyroxine , atorvastatin , sertraline , and a sleep aid. She takes levothyroxine  first thing in the morning and waits 30 minutes before eating. She uses a pill box to organize her medications but sometimes is unsure about the timing of taking them.      Recent call last week elevated BP 150, now taking meds more consistent but didn't take med today, it improved  HYPERTENSION improved - on Amlodipine  5mg , hydrochlorothiazide  25mg  daily, Metoprolol  XL 25mg      08/29/2023    9:29 AM 08/29/2023    9:28 AM 06/06/2023    1:20 PM  Depression screen PHQ 2/9  Decreased Interest 0 0 0  Down,  Depressed, Hopeless 0 0 0  PHQ - 2 Score 0 0 0  Altered sleeping 0    Change in appetite 0    Feeling bad or failure about yourself  0    Trouble concentrating 0    Moving slowly or fidgety/restless 0    Suicidal thoughts 0    PHQ-9 Score 0    Difficult doing work/chores Not difficult at all         08/29/2023    9:29 AM 02/01/2022    9:37 AM 06/13/2020   11:38 AM 01/24/2020    9:05 AM  GAD 7 : Generalized Anxiety Score  Nervous, Anxious, on Edge 0 0 1 2  Control/stop worrying 0 0 1 2  Worry too much - different things 1 1 2 2   Trouble relaxing 0 0 0 0  Restless 0 0 0 0  Easily annoyed or irritable 0 0 0 1  Afraid - awful might happen 0 0 1 1  Total GAD 7 Score 1 1 5 8   Anxiety Difficulty Somewhat difficult Not difficult at all Not difficult at all Not difficult at all    Social History   Tobacco Use   Smoking status: Never   Smokeless tobacco: Never  Vaping Use   Vaping status: Never Used  Substance Use Topics   Alcohol use: No   Drug use: No    Review of Systems Per HPI unless specifically indicated above     Objective:     BP 132/60 (BP Location: Left Arm, Patient Position:  Sitting, Cuff Size: Normal)   Pulse 82   Ht 5\' 9"  (1.753 m)   Wt 177 lb (80.3 kg)   SpO2 97%   BMI 26.14 kg/m   Wt Readings from Last 3 Encounters:  08/29/23 177 lb (80.3 kg)  05/17/23 165 lb (74.8 kg)  11/05/22 176 lb (79.8 kg)    Physical Exam Vitals and nursing note reviewed.  Constitutional:      General: She is not in acute distress.    Appearance: Normal appearance. She is well-developed. She is not diaphoretic.     Comments: Well-appearing, comfortable, cooperative  HENT:     Head: Normocephalic and atraumatic.  Eyes:     General:        Right eye: No discharge.        Left eye: No discharge.     Conjunctiva/sclera: Conjunctivae normal.  Neck:     Thyroid : No thyromegaly.  Cardiovascular:     Rate and Rhythm: Normal rate and regular rhythm.     Heart sounds:  Normal heart sounds. No murmur heard. Pulmonary:     Effort: Pulmonary effort is normal. No respiratory distress.     Breath sounds: Normal breath sounds. No wheezing or rales.  Musculoskeletal:        General: Normal range of motion.     Cervical back: Normal range of motion and neck supple.  Lymphadenopathy:     Cervical: No cervical adenopathy.  Skin:    General: Skin is warm and dry.     Findings: No erythema or rash.  Neurological:     Mental Status: She is alert and oriented to person, place, and time.  Psychiatric:        Mood and Affect: Mood normal.        Behavior: Behavior normal.        Thought Content: Thought content normal.     Comments: Well groomed, good eye contact, normal speech and thoughts     Results for orders placed or performed in visit on 05/17/23  Hemoglobin A1c   Collection Time: 05/08/23 12:00 AM  Result Value Ref Range   Hemoglobin A1C 5.7       Assessment & Plan:   Problem List Items Addressed This Visit     Benign essential HTN - Primary   Relevant Orders   CBC with Differential/Platelet   Comprehensive metabolic panel with GFR   Dyslipidemia   Relevant Orders   Lipid panel   Elevated hemoglobin A1c   Hypothyroidism   Relevant Orders   TSH   T4, free   Other Visit Diagnoses       Vitamin D  deficiency       Relevant Orders   VITAMIN D  25 Hydroxy (Vit-D Deficiency, Fractures)     Vitamin B12 deficiency       Relevant Orders   Vitamin B12        Hypertension Hypertension with recent elevated readings due to inconsistent medication adherence and caffeine intake. Today BP normalized but she states did not take meds. - Continue amlodipine , hydrochlorothiazide , and metoprolol  once daily. - Reduce caffeine intake by decreasing diet sodas. - Schedule consultation with pharmacist in July for medication review.  Hyperlipidemia Hyperlipidemia managed with atorvastatin . - Continue atorvastatin  as  prescribed.  Hypothyroidism Hypothyroidism managed with levothyroxine . - Continue levothyroxine  as prescribed, taking it first thing in the morning before eating.  Generalized Anxiety - Continue sertraline  as needed for anxiety management. Advised that this should be dosed daily or if  not needed can discontinue. She has taken AS NEEDED by her preference.  General Health Maintenance Advised to reduce sweets and diet sodas to improve health and manage blood pressure. - Encourage reduction of sweets and diet sodas. - Increase water intake, preferably with lemon.  Follow-up Follow-up plans for medication management and lab work. - Order comprehensive blood panel including thyroid , cholesterol, chemistry, kidney function, blood count, vitamin D  and B levels. - Schedule follow-up with pharmacist in July for medication review.      Route chart to Timoteo Force and see if she prefers in person in July instead of on phone?  Orders Placed This Encounter  Procedures   TSH   Lipid panel    Has the patient fasted?:   Yes   CBC with Differential/Platelet   Comprehensive metabolic panel with GFR    Has the patient fasted?:   Yes   VITAMIN D  25 Hydroxy (Vit-D Deficiency, Fractures)   Vitamin B12   T4, free    No orders of the defined types were placed in this encounter.   Follow up plan: Return if symptoms worsen or fail to improve.   Domingo Friend, DO [facility] 08/29/2023, 9:47 AM

## 2023-08-29 NOTE — Patient Instructions (Addendum)
 Thank you for coming to the office today.  Keep on current medications as discussed.  Take Levothyroxine  30 min before other meds in AM  Take hydrochlorothiazide , Amlodipine  and metoprolol  all at same time for BP daily.  Take Mirtazapine  in PM for bed  Labs today  We will work on arranging with clinical pharmacist Timoteo Force to review meds again in July   Please schedule a Follow-up Appointment to: Return if symptoms worsen or fail to improve.  If you have any other questions or concerns, please feel free to call the office or send a message through MyChart. You may also schedule an earlier appointment if necessary.  Additionally, you may be receiving a survey about your experience at our office within a few days to 1 week by e-mail or mail. We value your feedback.  Domingo Friend, DO [facility], [place]

## 2023-08-30 ENCOUNTER — Ambulatory Visit: Payer: Self-pay | Admitting: Family Medicine

## 2023-08-30 LAB — LIPID PANEL
Cholesterol: 155 mg/dL (ref <200)
HDL: 52 mg/dL (ref >=50)
LDL Cholesterol (Calc): 84 mg/dL
Non-HDL Cholesterol (Calc): 103 mg/dL (ref <130)
Total CHOL/HDL Ratio: 3 (calc) (ref <5.0)
Triglycerides: 93 mg/dL (ref <150)

## 2023-08-30 LAB — COMPREHENSIVE METABOLIC PANEL WITH GFR
AG Ratio: 1.3 (calc) (ref 1.0–2.5)
ALT: 10 U/L (ref 6–29)
AST: 14 U/L (ref 10–35)
Albumin: 4 g/dL (ref 3.6–5.1)
Alkaline phosphatase (APISO): 116 U/L (ref 37–153)
BUN: 20 mg/dL (ref 7–25)
CO2: 32 mmol/L (ref 20–32)
Calcium: 9.5 mg/dL (ref 8.6–10.4)
Chloride: 103 mmol/L (ref 98–110)
Creat: 0.83 mg/dL (ref 0.60–1.00)
Globulin: 3 g/dL (ref 1.9–3.7)
Glucose, Bld: 84 mg/dL (ref 65–139)
Potassium: 4 mmol/L (ref 3.5–5.3)
Sodium: 142 mmol/L (ref 135–146)
Total Bilirubin: 0.3 mg/dL (ref 0.2–1.2)
Total Protein: 7 g/dL (ref 6.1–8.1)
eGFR: 74 mL/min/{1.73_m2} (ref >=60)

## 2023-08-30 LAB — CBC WITH DIFFERENTIAL/PLATELET
Absolute Lymphocytes: 1979 {cells}/uL (ref 850–3900)
Absolute Monocytes: 918 {cells}/uL (ref 200–950)
Basophils Absolute: 61 {cells}/uL (ref 0–200)
Basophils Relative: 0.9 %
Eosinophils Absolute: 258 {cells}/uL (ref 15–500)
Eosinophils Relative: 3.8 %
HCT: 39.9 % (ref 35.0–45.0)
Hemoglobin: 11.8 g/dL (ref 11.7–15.5)
MCH: 21.1 pg — ABNORMAL LOW (ref 27.0–33.0)
MCHC: 29.6 g/dL — ABNORMAL LOW (ref 32.0–36.0)
MCV: 71.5 fL — ABNORMAL LOW (ref 80.0–100.0)
MPV: 11.8 fL (ref 7.5–12.5)
Monocytes Relative: 13.5 %
Neutro Abs: 3584 {cells}/uL (ref 1500–7800)
Neutrophils Relative %: 52.7 %
Platelets: 196 10*3/uL (ref 140–400)
RBC: 5.58 10*6/uL — ABNORMAL HIGH (ref 3.80–5.10)
RDW: 15 % (ref 11.0–15.0)
Total Lymphocyte: 29.1 %
WBC: 6.8 10*3/uL (ref 3.8–10.8)

## 2023-08-30 LAB — TSH: TSH: 1.2 m[IU]/L (ref 0.40–4.50)

## 2023-08-30 LAB — T4, FREE: Free T4: 1.2 ng/dL (ref 0.8–1.8)

## 2023-08-30 LAB — VITAMIN D 25 HYDROXY (VIT D DEFICIENCY, FRACTURES): Vit D, 25-Hydroxy: 58 ng/mL (ref 30–100)

## 2023-08-30 LAB — VITAMIN B12: Vitamin B-12: 1157 pg/mL — ABNORMAL HIGH (ref 200–1100)

## 2023-09-19 ENCOUNTER — Other Ambulatory Visit (INDEPENDENT_AMBULATORY_CARE_PROVIDER_SITE_OTHER): Admitting: Pharmacist

## 2023-09-19 VITALS — BP 113/56 | HR 67

## 2023-09-19 DIAGNOSIS — I10 Essential (primary) hypertension: Secondary | ICD-10-CM

## 2023-09-19 NOTE — Progress Notes (Signed)
 09/19/2023 Name: Sharon Craig MRN: [id number] DOB: 1950-08-06  Chief Complaint  Patient presents with   Medication Management   Medication Adherence    Sharon Craig is a 73 y.o. year old female who was referred for medication management by their primary care provider, Raina Bunting, DO. They presented for a face to face visit today.   They were referred to the pharmacist by their PCP for assistance in managing medication adherence    Subjective:  Care Team: Primary Care Provider: Raina Bunting, DO  Social Worker: [address] [place], [place]  Medication Access/Adherence  Current Pharmacy:  [id number] [place], [place] - [address] AT [facility] [address] [place] [address] [place] [address] Phone: [phone] Fax: [phone]  [facility] - [place], [place] - [address] [facility] [address] [place] [address] [place] [address] Phone: [phone] Fax: [phone]  [facility] [phone] - [place], [place] - [address] AT [facility] [address] [place] [address] Phone: [phone] Fax: [phone]   Patient reports affordability concerns with their medications: No  Patient reports access/transportation concerns to their pharmacy: No  Patient reports adherence concerns with their medications: No   Denies using weekly pillbox   Hypertension:   Current medications:  - amlodipine  5 mg daily - HCTZ 25 mg daily - metoprolol  ER 25 mg daily     Patient has an automated, upper arm home BP cuff Reports recent home blood pressure readings: - 5/27: 140/73, HR 67 - 5/30: 137/77 - 5/31: 122/75, HR 68  Today blood pressure reading in office: 113/56, HR 67   Patient denies hypotensive s/sx including dizziness, lightheadedness.     Objective:  Lab Results  Component Value Date   CREATININE 0.83 08/29/2023   BUN 20 08/29/2023   NA 142  08/29/2023   K 4.0 08/29/2023   CL 103 08/29/2023   CO2 32 08/29/2023    BP Readings from Last 3 Encounters:  09/19/23 (!) 113/56  08/29/23 132/60  05/17/23 134/60   Pulse Readings from Last 3 Encounters:  09/19/23 67  08/29/23 82  05/17/23 (!) 59      Medications Reviewed Today     Reviewed by Ardis Becton, RPH-CPP (Pharmacist) on 09/19/23 at 1205  Med List Status: <None>   Medication Order Taking? Sig Documenting Provider Last Dose Status Informant  amLODipine  (NORVASC ) 5 MG tablet [id number] Yes Take 1 tablet (5 mg total) by mouth daily. Raina Bunting, DO Taking Active   atorvastatin  (LIPITOR) 20 MG tablet [id number] Yes Take 1 tablet (20 mg total) by mouth daily. Raina Bunting, DO Taking Active   Cholecalciferol (D 1000) 25 MCG (1000 UT) capsule [id number]  Take by mouth. [provider]  Active   cyanocobalamin  (VITAMIN B12) 1000 MCG tablet [id number]  Take by mouth. [provider]  Active   ferrous sulfate 325 (65 FE) MG tablet [id number]  Take 325 mg by mouth daily with breakfast.  Patient not taking: Reported on 08/29/2023   [provider]  Active            Med Note (HILL, TIFFANY A   Tue Jan 23, 2019 11:14 AM)    fluticasone  (FLONASE ) 50 MCG/ACT nasal spray [id number]  SHAKE LIQUID AND USE 2 SPRAYS IN EACH NOSTRIL DAILY  Patient not taking: Reported on 08/29/2023  Raina Bunting, DO  Active   hydrochlorothiazide  (HYDRODIURIL ) 25 MG tablet [id number] Yes Take 1 tablet (25 mg total) by mouth daily. Raina Bunting, DO Taking Active   levothyroxine  (SYNTHROID ) 25 MCG tablet [id number] Yes Take 1 tablet (25 mcg total) by mouth daily before breakfast. Raina Bunting, DO Taking Active   metoprolol  succinate (TOPROL -XL) 25 MG 24 hr tablet [id number] Yes TAKE 1 TABLET(25 MG) BY MOUTH DAILY Romeo Co, Kayleen Party, DO Taking Active   mirtazapine  (REMERON ) 7.5 MG tablet [id number] Yes Take 1 tablet (7.5  mg total) by mouth at bedtime. Raina Bunting, DO Taking Active   sertraline  (ZOLOFT ) 50 MG tablet [id number]  Take 1 tablet (50 mg total) by mouth daily. Raina Bunting, DO  Active               Assessment/Plan:   Discuss importance of medication adherence. Encourage patient to take her medication consistently at same time each day.  - Encourage patient to continue to use weekly pillbox - Provide patient with detailed medication list including time of day for administration and indication for use     Hypertension: - Reviewed long term cardiovascular and renal outcomes of uncontrolled blood pressure - Reviewed appropriate blood pressure monitoring technique and reviewed goal blood pressure.  - Recommend to monitor home blood pressure, keep log of results and have this record to review at upcoming medical appointments.  Patient to contact provider office sooner if needed for readings outside of established parameters or symptoms, particularly for any symptoms of dizziness or lightheadedness        Follow Up Plan: Clinical Pharmacist will follow up with patient by telephone on 10/31/2023 at 11:00 AM    Arthur Lash, PharmD, Becky Bowels, CPP Clinical Pharmacist [facility] [phone]

## 2023-09-19 NOTE — Patient Instructions (Addendum)
 Goals Addressed             This Visit's Progress    Pharmacy Goals       It was a pleasure speaking with you today!   Please keep taking your levothyroxine each morning about 30 minutes before breakfast.   We talked about taking your blood pressure medications all at the same time each day.   Your blood pressure medications are:  - amlodipine 5 mg daily - hydrochlorothiazide 25 mg daily - metoprolol ER 25 mg daily   Check your blood pressure twice weekly, and any time you have concerning symptoms like headache, chest pain, dizziness, shortness of breath, or vision changes.    Our goal is less than 130/80.   To appropriately check your blood pressure, make sure you do the following:  1) Avoid caffeine, exercise, or tobacco products for 30 minutes before checking. Empty your bladder. 2) Sit with your back supported in a flat-backed chair. Rest your arm on something flat (arm of the chair, table, etc). 3) Sit still with your feet flat on the floor, resting, for at least 5 minutes.  4) Check your blood pressure. Take 1-2 readings.  5) Write down these readings and bring with you to any provider appointments.   Make sure you take your blood pressure medications before you come to any office visit, even if you were asked to fast for labs.   Please do not hesitate to contact me at the number below with any questions or additional information you have regarding your care.   Estelle Grumbles, PharmD, Patsy [place], CPP Clinical Pharmacist [facility] [phone]

## 2023-09-20 ENCOUNTER — Other Ambulatory Visit: Payer: Self-pay | Admitting: Family Medicine

## 2023-09-20 DIAGNOSIS — F5104 Psychophysiologic insomnia: Secondary | ICD-10-CM

## 2023-09-21 NOTE — Telephone Encounter (Signed)
 Requested by interface surescripts. Last OV 08/29/23 future visit 10/31/23.  Requested Prescriptions  Pending Prescriptions Disp Refills   mirtazapine  (REMERON ) 7.5 MG tablet [Pharmacy Med Name: MIRTAZAPINE  7.5MG  TABLETS] 30 tablet 0    Sig: TAKE 1 TABLET(7.5 MG) BY MOUTH AT BEDTIME     Psychiatry: Antidepressants - mirtazapine  Failed - 09/21/2023  4:29 PM      Failed - Valid encounter within last 6 months    Recent Outpatient Visits           3 weeks ago Benign essential HTN   [place] [place], Kayleen Party, DO              Passed - Completed PHQ-2 or PHQ-9 in the last 360 days

## 2023-10-31 ENCOUNTER — Other Ambulatory Visit (INDEPENDENT_AMBULATORY_CARE_PROVIDER_SITE_OTHER): Admitting: Pharmacist

## 2023-10-31 DIAGNOSIS — I10 Essential (primary) hypertension: Secondary | ICD-10-CM

## 2023-10-31 NOTE — Patient Instructions (Signed)
 Goals Addressed             This Visit's Progress    Pharmacy Goals       It was a pleasure speaking with you today!   Please keep taking your levothyroxine each morning about 30 minutes before breakfast.   We talked about taking your blood pressure medications all at the same time each day.   Your blood pressure medications are:  - amlodipine 5 mg daily - hydrochlorothiazide 25 mg daily - metoprolol ER 25 mg daily   Check your blood pressure twice weekly, and any time you have concerning symptoms like headache, chest pain, dizziness, shortness of breath, or vision changes.    Our goal is less than 130/80.   To appropriately check your blood pressure, make sure you do the following:  1) Avoid caffeine, exercise, or tobacco products for 30 minutes before checking. Empty your bladder. 2) Sit with your back supported in a flat-backed chair. Rest your arm on something flat (arm of the chair, table, etc). 3) Sit still with your feet flat on the floor, resting, for at least 5 minutes.  4) Check your blood pressure. Take 1-2 readings.  5) Write down these readings and bring with you to any provider appointments.   Make sure you take your blood pressure medications before you come to any office visit, even if you were asked to fast for labs.   Please do not hesitate to contact me at the number below with any questions or additional information you have regarding your care.   Estelle Grumbles, PharmD, Patsy [place], CPP Clinical Pharmacist [facility] [phone]

## 2023-10-31 NOTE — Progress Notes (Signed)
   10/31/2023 Name: Sharon Craig MRN: [id number] DOB: [birth year]  Chief Complaint  Patient presents with   Medication Management    Sharon Craig is a 73 y.o. year old female who presented for a telephone visit.   They were referred to the pharmacist by their PCP for assistance in managing medication adherence    Outreach to patient today as scheduled. Speak with patient only briefly today as patient reports that her husband recently had a stroke and that caregivers are at the home working with him today.   Reports that she has not checked her blood pressure recently as she has been busy caring for him. However, states that she has been feeling well and having an easier time with managing her medication since we last spoke, taking her blood pressure and cholesterol medications consistently each day at 1 pm. Requests to follow up another day regarding home BP readings - Reschedule rest of appointment as requested   Patient to monitor home blood pressure, keep log of results and have this record to review at upcoming medical appointments.  Patient to contact provider office sooner if needed for readings outside of established parameters or symptoms, particularly for any symptoms of dizziness or lightheadedness        Follow Up Plan: Clinical Pharmacist will follow up with patient by telephone next month  Sharon Craig, PharmD, JAQUELINE, CPP Clinical Pharmacist [facility] [phone]

## 2023-12-05 ENCOUNTER — Other Ambulatory Visit (INDEPENDENT_AMBULATORY_CARE_PROVIDER_SITE_OTHER): Admitting: Pharmacist

## 2023-12-05 DIAGNOSIS — I10 Essential (primary) hypertension: Secondary | ICD-10-CM

## 2023-12-05 NOTE — Progress Notes (Signed)
 12/05/2023 Name: Sharon Craig MRN: [id number] DOB: 10-Jun-1950  Chief Complaint  Patient presents with   Medication Management   Medication Adherence    Sharon Craig is a 73 y.o. year old female who was referred to the pharmacist by their PCP for assistance in managing medication adherence.    Subjective:  Care Team: Primary Care Provider: Edman Marsa PARAS, DO  Social Worker: Ermalinda Lenn [place], [place]  Medication Access/Adherence  Current Pharmacy:  [id number] [place], [place] - [address] AT [facility] OF [address] [place] [address] Phone: [phone] Fax: [phone]  [facility]) - [place], [place] - [address] [facility] [address] [place] [address] [place] [address] Phone: [phone] Fax: [phone]  [facility] [phone] - [place], [place] - [address] AT [facility] [address] [place] [address] Phone: [phone] Fax: [phone]   Patient reports affordability concerns with their medications: No  Patient reports access/transportation concerns to their pharmacy: No  Patient reports adherence concerns with their medications: No   Using weekly pillbox; denies missed doses   Hypertension:   Current medications:  - amlodipine  5 mg daily - HCTZ 25 mg daily - metoprolol  ER 25 mg daily     Patient has an automated, upper arm home BP cuff; denies checking home blood pressure recently as has been busy caring for her husband    Patient denies hypotensive s/sx including dizziness, lightheadedness.   Objective:   Lab Results  Component Value Date   CREATININE 0.83 08/29/2023   BUN 20 08/29/2023   NA 142 08/29/2023   K 4.0 08/29/2023   CL 103 08/29/2023   CO2 32 08/29/2023    Lab Results  Component Value Date   CHOL 155 08/29/2023   HDL 52 08/29/2023   LDLCALC 84 08/29/2023   TRIG 93 08/29/2023   CHOLHDL 3.0  08/29/2023    Medications Reviewed Today     Reviewed by Alana Sharyle LABOR, RPH-CPP (Pharmacist) on 12/05/23 at 1113  Med List Status: <None>   Medication Order Taking? Sig Documenting Provider Last Dose Status Informant  amLODipine  (NORVASC ) 5 MG tablet [id number] Yes Take 1 tablet (5 mg total) by mouth daily. Karamalegos, Marsa PARAS, DO  Active   atorvastatin  (LIPITOR) 20 MG tablet [id number] Yes Take 1 tablet (20 mg total) by mouth daily. Edman Marsa PARAS, DO  Active   Cholecalciferol (D 1000) 25 MCG (1000 UT) capsule [id number]  Take by mouth. [provider]  Active   cyanocobalamin  (VITAMIN B12) 1000 MCG tablet [id number]  Take by mouth. [provider]  Active   ferrous sulfate 325 (65 FE) MG tablet [id number]  Take 325 mg by mouth daily with breakfast.  Patient not taking: Reported on 08/29/2023   [provider]  Active            Med Note (HILL, TIFFANY A   Tue Jan 23, 2019 11:14 AM)    fluticasone  (FLONASE ) 50 MCG/ACT nasal spray [id number]  SHAKE LIQUID AND USE 2 SPRAYS IN EACH NOSTRIL DAILY  Patient not taking: Reported on 08/29/2023   Edman Marsa PARAS, DO  Active   hydrochlorothiazide  (HYDRODIURIL ) 25 MG tablet [id number] Yes Take 1 tablet (25 mg total) by mouth daily. Edman Marsa PARAS, DO  Active   levothyroxine  (SYNTHROID ) 25 MCG tablet [id number] Yes Take 1 tablet (25 mcg  total) by mouth daily before breakfast. Edman Marsa PARAS, DO  Active   metoprolol  succinate (TOPROL -XL) 25 MG 24 hr tablet [id number] Yes TAKE 1 TABLET(25 MG) BY MOUTH DAILY Edman Marsa PARAS, DO  Active   mirtazapine  (REMERON ) 7.5 MG tablet [id number]  TAKE 1 TABLET(7.5 MG) BY MOUTH AT BEDTIME Karamalegos, Marsa PARAS, DO  Active   sertraline  (ZOLOFT ) 50 MG tablet [id number]  Take 1 tablet (50 mg total) by mouth daily. Edman Marsa PARAS, DO  Active               Assessment/Plan:   States will contact office to schedule next follow up  appointment with PCP  Discuss importance of medication adherence. Encourage patient to take her medication consistently at same time each day.  - Encourage patient to continue to use weekly pillbox      Hypertension: - Reviewed long term cardiovascular and renal outcomes of uncontrolled blood pressure - Reviewed appropriate blood pressure monitoring technique and reviewed goal blood pressure.  - Recommend to monitor home blood pressure, keep log of results and have this record to review at upcoming medical appointments.  Patient to contact provider office sooner if needed for readings outside of established parameters or symptoms, particularly for any symptoms of dizziness or lightheadedness        Follow Up Plan: Clinical Pharmacist will follow up with patient by telephone on 03/05/2024 at 11:00 AM    Sharyle Sia, PharmD, JAQUELINE, CPP Clinical Pharmacist [phone]

## 2023-12-05 NOTE — Patient Instructions (Signed)
 Goals Addressed             This Visit's Progress    Pharmacy Goals       It was a pleasure speaking with you today!   Please keep taking your levothyroxine each morning about 30 minutes before breakfast.   We talked about taking your blood pressure medications all at the same time each day.   Your blood pressure medications are:  - amlodipine 5 mg daily - hydrochlorothiazide 25 mg daily - metoprolol ER 25 mg daily   Check your blood pressure twice weekly, and any time you have concerning symptoms like headache, chest pain, dizziness, shortness of breath, or vision changes.    Our goal is less than 130/80.   To appropriately check your blood pressure, make sure you do the following:  1) Avoid caffeine, exercise, or tobacco products for 30 minutes before checking. Empty your bladder. 2) Sit with your back supported in a flat-backed chair. Rest your arm on something flat (arm of the chair, table, etc). 3) Sit still with your feet flat on the floor, resting, for at least 5 minutes.  4) Check your blood pressure. Take 1-2 readings.  5) Write down these readings and bring with you to any provider appointments.   Make sure you take your blood pressure medications before you come to any office visit, even if you were asked to fast for labs.   Please do not hesitate to contact me at the number below with any questions or additional information you have regarding your care.   Estelle Grumbles, PharmD, Patsy [place], CPP Clinical Pharmacist [facility] [phone]

## 2024-03-05 ENCOUNTER — Telehealth: Payer: Self-pay | Admitting: Pharmacist

## 2024-03-05 ENCOUNTER — Other Ambulatory Visit

## 2024-03-05 NOTE — Progress Notes (Unsigned)
   Outreach Note  03/05/2024 Name: Tyne Banta MRN: [id number] DOB: [birth year]  Referred by: Edman Marsa PARAS, DO  Was unable to reach patient via telephone today and have left HIPAA compliant voicemail asking patient to return my call.   Follow Up Plan: Will collaborate with Care Guide to outreach to schedule follow up with me  Sharyle Sia, PharmD, JAQUELINE, CPP Clinical Pharmacist [facility] [phone]

## 2024-03-19 ENCOUNTER — Telehealth: Payer: Self-pay

## 2024-03-19 NOTE — Progress Notes (Unsigned)
 Complex Care Management Care Guide Note  03/19/2024 Name: Sharon Craig MRN: [id number] DOB: 10/31/1950  Sharon Craig is a 73 y.o. year old female who is a primary care patient of Edman Marsa PARAS, DO and is actively engaged with the care management team. I reached out to Sharon Craig by phone today to assist with re-scheduling  with the Pharmacist.  Follow up plan: Unsuccessful telephone outreach attempt made. A HIPAA compliant phone message was left for the patient providing contact information and requesting a return call.  Jeoffrey Buffalo , RMA     [facility]  [facility] Guide  Direct Dial: [phone]  Website: [email or web address]

## 2024-03-27 NOTE — Progress Notes (Signed)
 Complex Care Management Care Guide Note  03/27/2024 Name: Sharon Craig MRN: [id number] DOB: [birth year]  Sharon Craig is a 73 y.o. year old female who is a primary care patient of Edman Marsa PARAS, DO and is actively engaged with the care management team. I reached out to Sharon Craig by phone today to assist with re-scheduling  with the Pharmacist.  Follow up plan: Unsuccessful telephone outreach attempt made. A HIPAA compliant phone message was left for the patient providing contact information and requesting a return call.  Jeoffrey Buffalo , RMA     [facility]  [facility] Guide  Direct Dial: [phone]  Website: [email or web address]

## 2024-05-11 ENCOUNTER — Telehealth: Payer: Self-pay | Admitting: Family Medicine

## 2024-05-11 DIAGNOSIS — I10 Essential (primary) hypertension: Secondary | ICD-10-CM

## 2024-05-11 NOTE — Telephone Encounter (Signed)
 Requested medication (s) are due for refill today - yes  Requested medication (s) are on the active medication list -yes  Future visit scheduled -no  Last refill: 04/15/23 #90 3RF  Notes to clinic: Last PCP visit 08/29/23, multiple pharmacist visits- is this a 1 year Rx- or should patient be scheduled for 6 month PCP follow up?  Requested Prescriptions  Pending Prescriptions Disp Refills   metoprolol  succinate (TOPROL -XL) 25 MG 24 hr tablet [Pharmacy Med Name: METOPROLOL  ER SUCCINATE 25MG  TABS] 90 tablet 3    Sig: TAKE 1 TABLET(25 MG) BY MOUTH DAILY     Cardiovascular:  Beta Blockers Failed - 05/11/2024 12:10 PM      Failed - Valid encounter within last 6 months    Recent Outpatient Visits           8 months ago Benign essential HTN   [facility] [place], Marsa PARAS, DO              Passed - Last BP in normal range    BP Readings from Last 1 Encounters:  09/19/23 (!) 113/56         Passed - Last Heart Rate in normal range    Pulse Readings from Last 1 Encounters:  09/19/23 67            Requested Prescriptions  Pending Prescriptions Disp Refills   metoprolol  succinate (TOPROL -XL) 25 MG 24 hr tablet [Pharmacy Med Name: METOPROLOL  ER SUCCINATE 25MG  TABS] 90 tablet 3    Sig: TAKE 1 TABLET(25 MG) BY MOUTH DAILY     Cardiovascular:  Beta Blockers Failed - 05/11/2024 12:10 PM      Failed - Valid encounter within last 6 months    Recent Outpatient Visits           8 months ago Benign essential HTN   [facility] [place], Marsa PARAS, DO              Passed - Last BP in normal range    BP Readings from Last 1 Encounters:  09/19/23 (!) 113/56         Passed - Last Heart Rate in normal range    Pulse Readings from Last 1 Encounters:  09/19/23 67

## 2024-05-23 ENCOUNTER — Other Ambulatory Visit: Payer: Self-pay | Admitting: Family Medicine

## 2024-05-23 ENCOUNTER — Encounter: Admitting: Family Medicine

## 2024-05-23 DIAGNOSIS — E785 Hyperlipidemia, unspecified: Secondary | ICD-10-CM

## 2024-05-25 ENCOUNTER — Other Ambulatory Visit: Payer: Self-pay | Admitting: Family Medicine

## 2024-05-25 DIAGNOSIS — E785 Hyperlipidemia, unspecified: Secondary | ICD-10-CM

## 2024-05-25 NOTE — Telephone Encounter (Signed)
 Courtesy refill. Patient will need an office visit for additional refills.  Requested Prescriptions  Pending Prescriptions Disp Refills   atorvastatin  (LIPITOR) 20 MG tablet [Pharmacy Med Name: ATORVASTATIN  20MG  TABLETS] 90 tablet 3    Sig: TAKE 1 TABLET(20 MG) BY MOUTH DAILY     Cardiovascular:  Antilipid - Statins Failed - 05/25/2024 11:35 AM      Failed - Lipid Panel in normal range within the last 12 months    Cholesterol, Total  Date Value Ref Range Status  12/12/2014 162 100 - 199 mg/dL Final   Cholesterol  Date Value Ref Range Status  08/29/2023 155 <200 mg/dL Final   LDL Cholesterol (Calc)  Date Value Ref Range Status  08/29/2023 84 mg/dL (calc) Final    Comment:    Reference range: <100 . Desirable range <100 mg/dL for primary prevention;   <70 mg/dL for patients with CHD or diabetic patients  with > or = 2 CHD risk factors. SABRA LDL-C is now calculated using the Martin-Hopkins  calculation, which is a validated novel method providing  better accuracy than the Friedewald equation in the  estimation of LDL-C.  Gladis APPLETHWAITE et al. SANDREA. 7986;689(80): 2061-2068  (http://education.[employer].com/faq/FAQ164)    HDL  Date Value Ref Range Status  08/29/2023 52 > OR = 50 mg/dL Final  91/74/7983 47 >60 mg/dL Final    Comment:    According to ATP-III Guidelines, HDL-C >59 mg/dL is considered a negative risk factor for CHD.    Triglycerides  Date Value Ref Range Status  08/29/2023 93 <150 mg/dL Final         Passed - Patient is not pregnant      Passed - Valid encounter within last 12 months    Recent Outpatient Visits           9 months ago Benign essential HTN   [facility] [place], Marsa PARAS, [place]
